# Patient Record
Sex: Male | Born: 1975 | Race: White | Hispanic: No | Marital: Married | Smoking: Current every day smoker
Health system: Southern US, Community
[De-identification: ages and names within clinical notes are randomized; demographics above are authoritative.]

## PROBLEM LIST (undated history)

## (undated) HISTORY — PX: APPENDECTOMY: SHX54

## (undated) HISTORY — PX: VASECTOMY: SHX75

---

## 2009-09-03 ENCOUNTER — Emergency Department (HOSPITAL_BASED_OUTPATIENT_CLINIC_OR_DEPARTMENT_OTHER): Admission: EM | Admit: 2009-09-03 | Discharge: 2009-09-03 | Payer: Self-pay | Admitting: Emergency Medicine

## 2009-09-04 ENCOUNTER — Emergency Department (HOSPITAL_BASED_OUTPATIENT_CLINIC_OR_DEPARTMENT_OTHER): Admission: EM | Admit: 2009-09-04 | Discharge: 2009-09-05 | Payer: Self-pay | Admitting: Emergency Medicine

## 2009-09-04 ENCOUNTER — Ambulatory Visit: Payer: Self-pay | Admitting: Diagnostic Radiology

## 2010-10-09 LAB — CBC
Hemoglobin: 15.9 g/dL (ref 13.0–17.0)
MCHC: 35.1 g/dL (ref 30.0–36.0)
MCV: 89.6 fL (ref 78.0–100.0)
RBC: 5.05 MIL/uL (ref 4.22–5.81)
RDW: 11.8 % (ref 11.5–15.5)

## 2010-10-09 LAB — URINALYSIS, ROUTINE W REFLEX MICROSCOPIC
Bilirubin Urine: NEGATIVE
Nitrite: NEGATIVE
Specific Gravity, Urine: 1.006 (ref 1.005–1.030)
Urobilinogen, UA: 1 mg/dL (ref 0.0–1.0)
pH: 6 (ref 5.0–8.0)

## 2010-10-09 LAB — COMPREHENSIVE METABOLIC PANEL
Albumin: 4.3 g/dL (ref 3.5–5.2)
Alkaline Phosphatase: 68 U/L (ref 39–117)
BUN: 18 mg/dL (ref 6–23)
Calcium: 8.8 mg/dL (ref 8.4–10.5)
GFR calc Af Amer: >60 mL/min (ref >60)
Glucose, Bld: 116 mg/dL — ABNORMAL HIGH (ref 70–99)
Sodium: 137 mEq/L (ref 135–145)
Total Bilirubin: 0.9 mg/dL (ref 0.3–1.2)
Total Protein: 8.7 g/dL — ABNORMAL HIGH (ref 6.0–8.3)

## 2010-10-09 LAB — DIFFERENTIAL
Lymphs Abs: 2.3 10*3/uL (ref 0.7–4.0)
Neutro Abs: 14.5 10*3/uL — ABNORMAL HIGH (ref 1.7–7.7)

## 2010-10-09 LAB — LIPASE, BLOOD: Lipase: 45 U/L (ref 23–300)

## 2011-03-22 IMAGING — CR DG CHEST 2V
2 series · 2 of 2 positions shown · non-contrast
Comparison: None.

CLINICAL DATA: Flu-like symptoms.  Nausea and vomiting.

CHEST - 2 VIEW 09/04/2009:

[w chest pa]
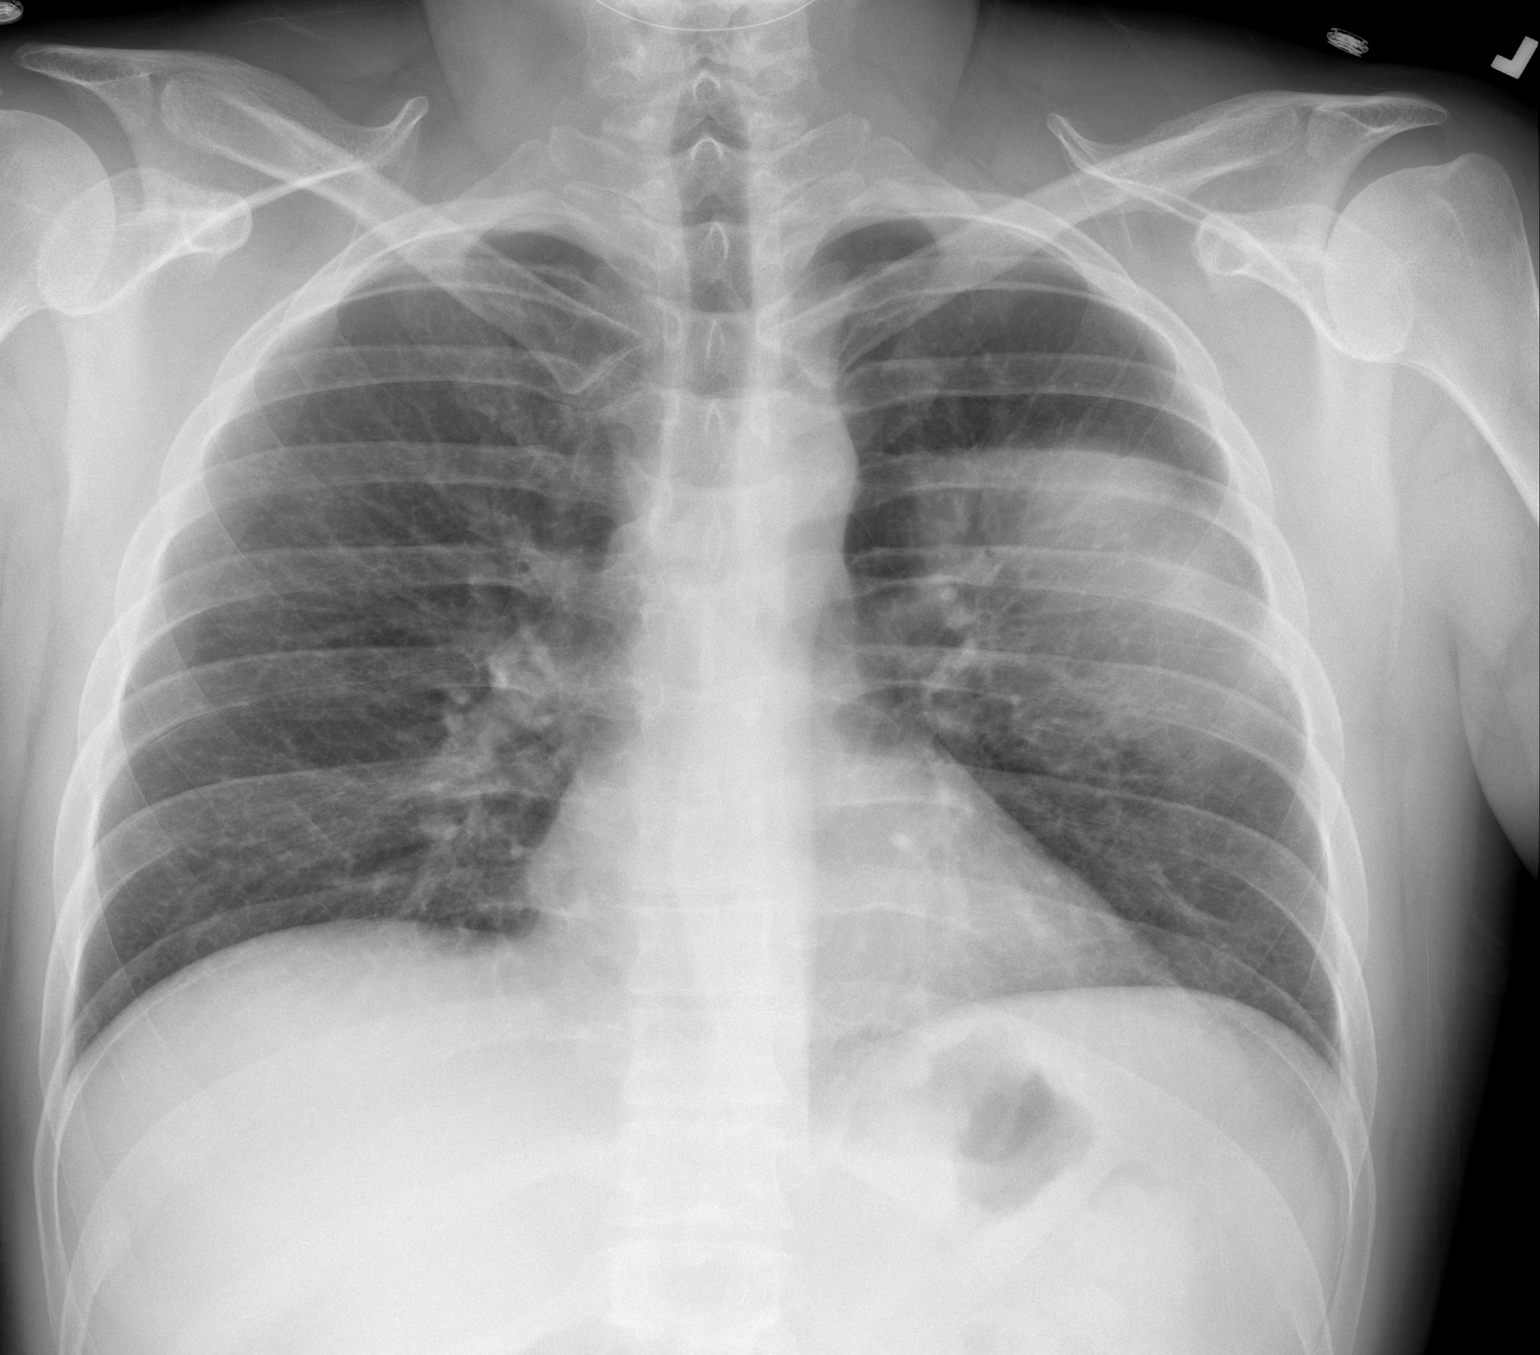

[w chest lat]
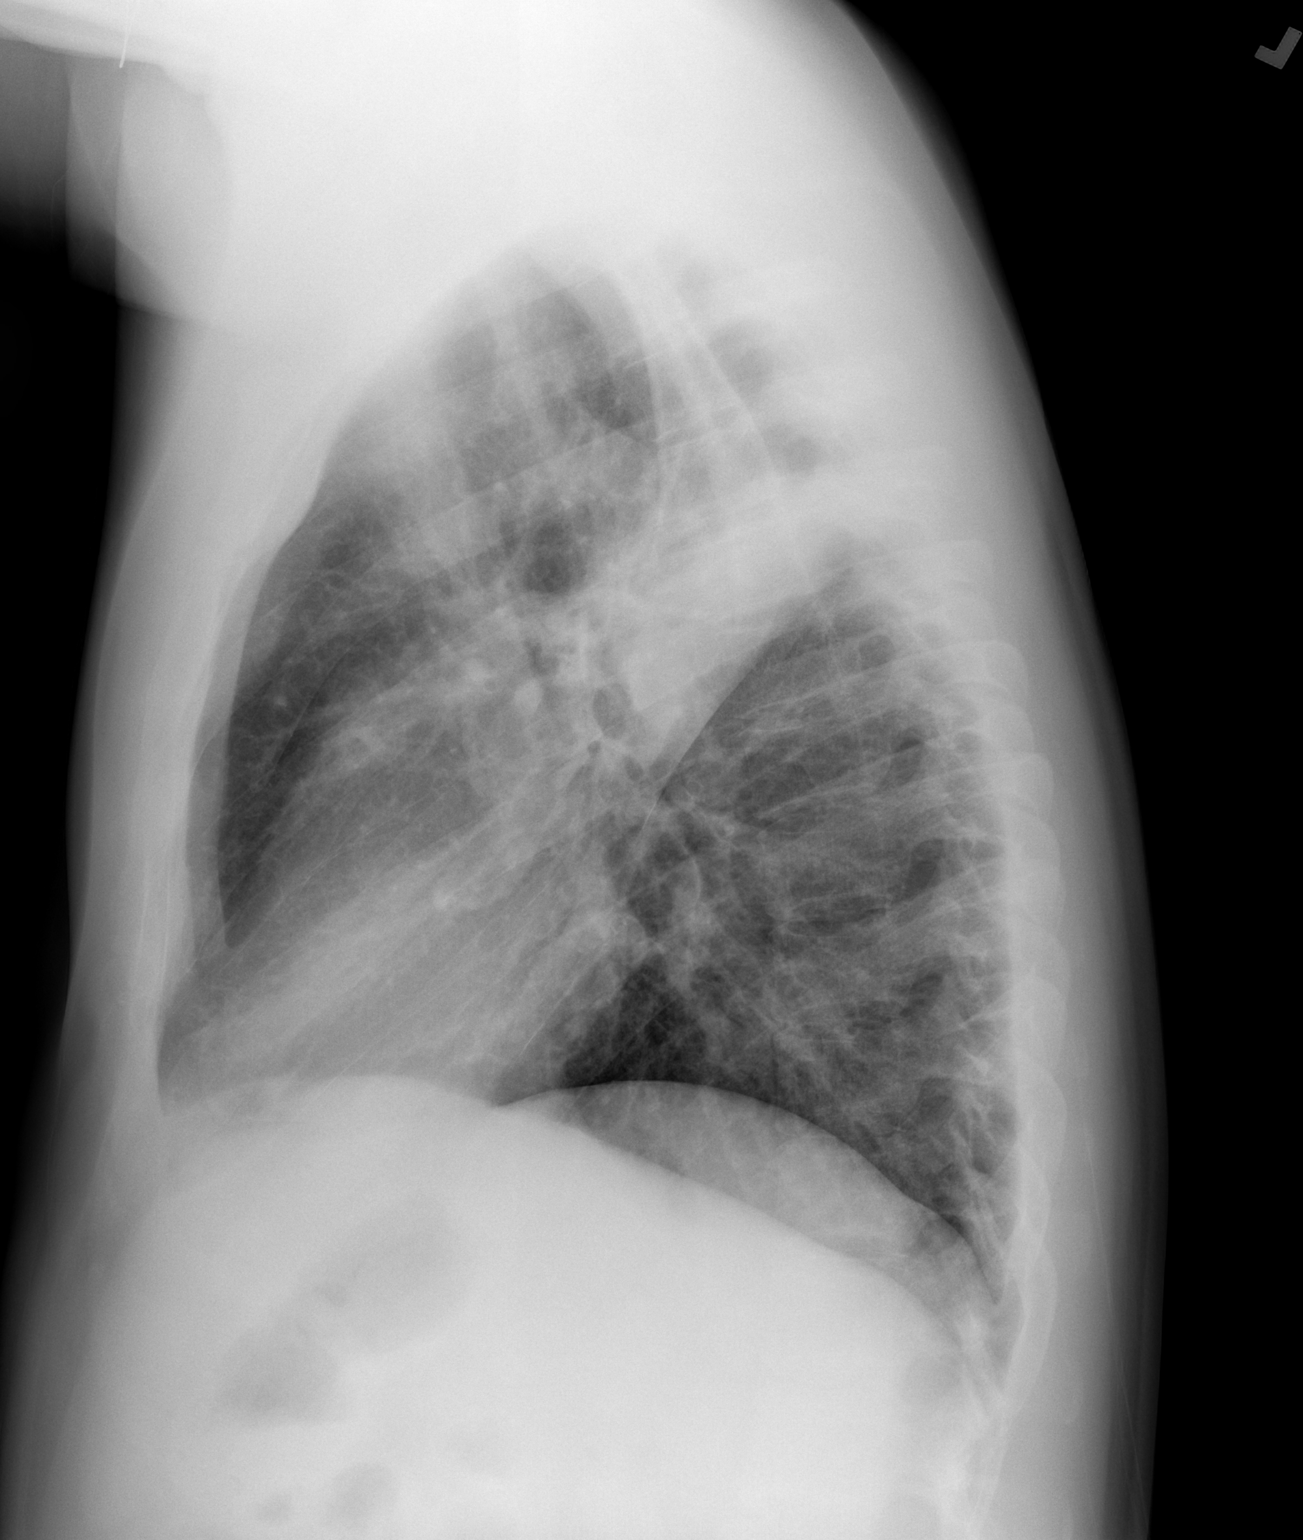

[2 of 2 positions shown; findings below may reference images not displayed]

FINDINGS: Dense airspace consolidation in the posterior left upper
lobe.  Lungs otherwise clear.  Cardiomediastinal silhouette
unremarkable.  No pleural effusions.  Visualized bony thorax
intact.
IMPRESSION: Left upper lobe pneumonia.

## 2015-03-02 ENCOUNTER — Observation Stay (HOSPITAL_BASED_OUTPATIENT_CLINIC_OR_DEPARTMENT_OTHER)
Admission: EM | Admit: 2015-03-02 | Discharge: 2015-03-03 | Disposition: A | Discharge status: Home / Self Care | Payer: BLUE CROSS/BLUE SHIELD | Attending: Surgery | Admitting: Surgery

## 2015-03-02 ENCOUNTER — Encounter (HOSPITAL_COMMUNITY)
Admission: EM | Disposition: A | Discharge status: Home / Self Care | Payer: Self-pay | Source: Home / Self Care | Attending: Physician Assistant

## 2015-03-02 ENCOUNTER — Emergency Department (HOSPITAL_COMMUNITY): Payer: BLUE CROSS/BLUE SHIELD | Admitting: Anesthesiology

## 2015-03-02 ENCOUNTER — Emergency Department (HOSPITAL_BASED_OUTPATIENT_CLINIC_OR_DEPARTMENT_OTHER): Payer: BLUE CROSS/BLUE SHIELD

## 2015-03-02 ENCOUNTER — Encounter (HOSPITAL_BASED_OUTPATIENT_CLINIC_OR_DEPARTMENT_OTHER): Payer: Self-pay | Admitting: *Deleted

## 2015-03-02 DIAGNOSIS — F1290 Cannabis use, unspecified, uncomplicated: Secondary | ICD-10-CM | POA: Insufficient documentation

## 2015-03-02 DIAGNOSIS — F17210 Nicotine dependence, cigarettes, uncomplicated: Secondary | ICD-10-CM | POA: Insufficient documentation

## 2015-03-02 DIAGNOSIS — K3580 Unspecified acute appendicitis: Secondary | ICD-10-CM | POA: Diagnosis not present

## 2015-03-02 DIAGNOSIS — R112 Nausea with vomiting, unspecified: Secondary | ICD-10-CM | POA: Diagnosis present

## 2015-03-02 DIAGNOSIS — Z885 Allergy status to narcotic agent status: Secondary | ICD-10-CM | POA: Insufficient documentation

## 2015-03-02 DIAGNOSIS — K3589 Other acute appendicitis without perforation or gangrene: Secondary | ICD-10-CM

## 2015-03-02 HISTORY — PX: LAPAROSCOPIC APPENDECTOMY: SHX408

## 2015-03-02 LAB — COMPREHENSIVE METABOLIC PANEL
ALBUMIN: 4.2 g/dL (ref 3.5–5.0)
ALK PHOS: 41 U/L (ref 38–126)
ALT: 14 U/L — ABNORMAL LOW (ref 17–63)
ANION GAP: 10 (ref 5–15)
AST: 22 U/L (ref 15–41)
BILIRUBIN TOTAL: 0.9 mg/dL (ref 0.3–1.2)
BUN: 11 mg/dL (ref 6–20)
CALCIUM: 9.2 mg/dL (ref 8.9–10.3)
CHLORIDE: 101 mmol/L (ref 101–111)
CO2: 25 mmol/L (ref 22–32)
Creatinine, Ser: 0.96 mg/dL (ref 0.61–1.24)
GFR calc Af Amer: >60 mL/min (ref >60)
GFR calc non Af Amer: >60 mL/min (ref >60)
GLUCOSE: 129 mg/dL — AB (ref 65–99)
Potassium: 3.5 mmol/L (ref 3.5–5.1)
SODIUM: 136 mmol/L (ref 135–145)
Total Protein: 7.2 g/dL (ref 6.5–8.1)

## 2015-03-02 LAB — CBC WITH DIFFERENTIAL/PLATELET
BASOS ABS: 0 10*3/uL (ref 0.0–0.1)
Basophils Relative: 0 % (ref 0–1)
EOS PCT: 0 % (ref 0–5)
Eosinophils Absolute: 0 10*3/uL (ref 0.0–0.7)
HEMATOCRIT: 46.3 % (ref 39.0–52.0)
Hemoglobin: 16 g/dL (ref 13.0–17.0)
LYMPHS ABS: 1.4 10*3/uL (ref 0.7–4.0)
Lymphocytes Relative: 10 % — ABNORMAL LOW (ref 12–46)
MCH: 31.3 pg (ref 26.0–34.0)
MCHC: 34.6 g/dL (ref 30.0–36.0)
MCV: 90.6 fL (ref 78.0–100.0)
MONO ABS: 1 10*3/uL (ref 0.1–1.0)
Monocytes Relative: 7 % (ref 3–12)
Neutro Abs: 11.7 10*3/uL — ABNORMAL HIGH (ref 1.7–7.7)
Neutrophils Relative %: 83 % — ABNORMAL HIGH (ref 43–77)
PLATELETS: 304 10*3/uL (ref 150–400)
RBC: 5.11 MIL/uL (ref 4.22–5.81)
RDW: 13 % (ref 11.5–15.5)
WBC: 14.1 10*3/uL — AB (ref 4.0–10.5)

## 2015-03-02 LAB — URINALYSIS, ROUTINE W REFLEX MICROSCOPIC
BILIRUBIN URINE: NEGATIVE
GLUCOSE, UA: NEGATIVE mg/dL
HGB URINE DIPSTICK: NEGATIVE
KETONES UR: NEGATIVE mg/dL
LEUKOCYTES UA: NEGATIVE
Nitrite: NEGATIVE
Protein, ur: NEGATIVE mg/dL
SPECIFIC GRAVITY, URINE: 1.01 (ref 1.005–1.030)
UROBILINOGEN UA: 0.2 mg/dL (ref 0.0–1.0)
pH: 8.5 — ABNORMAL HIGH (ref 5.0–8.0)

## 2015-03-02 LAB — LIPASE, BLOOD: Lipase: 20 U/L — ABNORMAL LOW (ref 22–51)

## 2015-03-02 SURGERY — APPENDECTOMY, LAPAROSCOPIC
Anesthesia: General | Site: Abdomen

## 2015-03-02 MED ORDER — LACTATED RINGERS IR SOLN
Status: DC | PRN
  Administered 2015-03-02: 1000 mL

## 2015-03-02 MED ORDER — PROPOFOL 10 MG/ML IV BOLUS
INTRAVENOUS | Status: AC
  Filled 2015-03-02: qty 20

## 2015-03-02 MED ORDER — FENTANYL CITRATE (PF) 250 MCG/5ML IJ SOLN
INTRAMUSCULAR | Status: AC
  Filled 2015-03-02: qty 25

## 2015-03-02 MED ORDER — FENTANYL CITRATE (PF) 100 MCG/2ML IJ SOLN
100.0000 ug | Freq: Once | INTRAMUSCULAR | Status: AC
  Administered 2015-03-02: 100 ug via INTRAVENOUS
  Filled 2015-03-02: qty 2

## 2015-03-02 MED ORDER — GLYCOPYRROLATE 0.2 MG/ML IJ SOLN
INTRAMUSCULAR | Status: AC
  Filled 2015-03-02: qty 3

## 2015-03-02 MED ORDER — LACTATED RINGERS IV SOLN
INTRAVENOUS | Status: DC | PRN
  Administered 2015-03-02 (×2): via INTRAVENOUS

## 2015-03-02 MED ORDER — DEXAMETHASONE SODIUM PHOSPHATE 10 MG/ML IJ SOLN
INTRAMUSCULAR | Status: DC | PRN
  Administered 2015-03-02: 10 mg via INTRAVENOUS

## 2015-03-02 MED ORDER — NICOTINE 14 MG/24HR TD PT24
14.0000 mg | MEDICATED_PATCH | Freq: Every day | TRANSDERMAL | Status: DC
  Filled 2015-03-02 (×2): qty 1

## 2015-03-02 MED ORDER — NEOSTIGMINE METHYLSULFATE 10 MG/10ML IV SOLN
INTRAVENOUS | Status: AC
  Filled 2015-03-02: qty 1

## 2015-03-02 MED ORDER — GLYCOPYRROLATE 0.2 MG/ML IJ SOLN
INTRAMUSCULAR | Status: DC | PRN
  Administered 2015-03-02: .8 mg via INTRAVENOUS

## 2015-03-02 MED ORDER — DEXTROSE 5 % IV SOLN
2.0000 g | INTRAVENOUS | Status: DC | PRN
  Administered 2015-03-02: 2 g via INTRAVENOUS

## 2015-03-02 MED ORDER — LIDOCAINE HCL (CARDIAC) 20 MG/ML IV SOLN
INTRAVENOUS | Status: DC | PRN
  Administered 2015-03-02: 100 mg via INTRAVENOUS

## 2015-03-02 MED ORDER — PROMETHAZINE HCL 25 MG/ML IJ SOLN: 6.2500 mg | INTRAMUSCULAR | Status: DC | PRN

## 2015-03-02 MED ORDER — ONDANSETRON 4 MG PO TBDP: 4.0000 mg | ORAL_TABLET | Freq: Four times a day (QID) | ORAL | Status: DC | PRN

## 2015-03-02 MED ORDER — LIDOCAINE HCL (CARDIAC) 20 MG/ML IV SOLN
INTRAVENOUS | Status: AC
  Filled 2015-03-02: qty 5

## 2015-03-02 MED ORDER — HYDROMORPHONE HCL 1 MG/ML IJ SOLN
INTRAMUSCULAR | Status: AC
  Filled 2015-03-02: qty 1

## 2015-03-02 MED ORDER — PROPOFOL 10 MG/ML IV BOLUS
INTRAVENOUS | Status: DC | PRN
  Administered 2015-03-02: 200 mg via INTRAVENOUS

## 2015-03-02 MED ORDER — ONDANSETRON HCL 4 MG/2ML IJ SOLN: 4.0000 mg | Freq: Four times a day (QID) | INTRAMUSCULAR | Status: DC | PRN

## 2015-03-02 MED ORDER — BUPIVACAINE-EPINEPHRINE 0.25% -1:200000 IJ SOLN
INTRAMUSCULAR | Status: DC | PRN
  Administered 2015-03-02: 7 mL

## 2015-03-02 MED ORDER — ONDANSETRON HCL 4 MG/2ML IJ SOLN
INTRAMUSCULAR | Status: DC | PRN
  Administered 2015-03-02: 4 mg via INTRAVENOUS

## 2015-03-02 MED ORDER — ONDANSETRON HCL 4 MG/2ML IJ SOLN
4.0000 mg | Freq: Once | INTRAMUSCULAR | Status: AC
  Administered 2015-03-02: 4 mg via INTRAVENOUS

## 2015-03-02 MED ORDER — ROCURONIUM BROMIDE 100 MG/10ML IV SOLN
INTRAVENOUS | Status: DC | PRN
  Administered 2015-03-02: 30 mg via INTRAVENOUS
  Administered 2015-03-02: 10 mg via INTRAVENOUS

## 2015-03-02 MED ORDER — LACTATED RINGERS IV SOLN: INTRAVENOUS | Status: DC

## 2015-03-02 MED ORDER — BUPIVACAINE-EPINEPHRINE 0.25% -1:200000 IJ SOLN
INTRAMUSCULAR | Status: AC
Start: 2015-03-02 — End: 2015-03-02
  Filled 2015-03-02: qty 1

## 2015-03-02 MED ORDER — ROCURONIUM BROMIDE 100 MG/10ML IV SOLN
INTRAVENOUS | Status: AC
  Filled 2015-03-02: qty 1

## 2015-03-02 MED ORDER — MIDAZOLAM HCL 2 MG/2ML IJ SOLN
INTRAMUSCULAR | Status: AC
  Filled 2015-03-02: qty 4

## 2015-03-02 MED ORDER — IOHEXOL 300 MG/ML  SOLN
100.0000 mL | Freq: Once | INTRAMUSCULAR | Status: AC | PRN
  Administered 2015-03-02: 100 mL via INTRAVENOUS

## 2015-03-02 MED ORDER — MIDAZOLAM HCL 5 MG/5ML IJ SOLN
INTRAMUSCULAR | Status: DC | PRN
  Administered 2015-03-02: 2 mg via INTRAVENOUS

## 2015-03-02 MED ORDER — ONDANSETRON HCL 4 MG/2ML IJ SOLN
INTRAMUSCULAR | Status: AC
  Filled 2015-03-02: qty 2

## 2015-03-02 MED ORDER — NEOSTIGMINE METHYLSULFATE 10 MG/10ML IV SOLN
INTRAVENOUS | Status: DC | PRN
  Administered 2015-03-02: 5 mg via INTRAVENOUS

## 2015-03-02 MED ORDER — DEXTROSE 5 % IV SOLN
INTRAVENOUS | Status: AC
  Filled 2015-03-02: qty 2

## 2015-03-02 MED ORDER — FENTANYL CITRATE (PF) 100 MCG/2ML IJ SOLN
100.0000 ug | Freq: Once | INTRAMUSCULAR | Status: AC
  Administered 2015-03-02 (×2): 100 ug via INTRAVENOUS
  Administered 2015-03-02: 50 ug via INTRAVENOUS

## 2015-03-02 MED ORDER — HYDROMORPHONE HCL 1 MG/ML IJ SOLN
0.5000 mg | INTRAMUSCULAR | Status: DC | PRN
  Administered 2015-03-02: 0.5 mg via INTRAVENOUS
  Filled 2015-03-02: qty 1

## 2015-03-02 MED ORDER — HYDROMORPHONE HCL 1 MG/ML IJ SOLN
0.2500 mg | INTRAMUSCULAR | Status: DC | PRN
  Administered 2015-03-02 (×2): 0.5 mg via INTRAVENOUS

## 2015-03-02 MED ORDER — IOHEXOL 300 MG/ML  SOLN
50.0000 mL | Freq: Once | INTRAMUSCULAR | Status: AC | PRN
  Administered 2015-03-02: 50 mL via ORAL

## 2015-03-02 MED ORDER — HYDROCODONE-ACETAMINOPHEN 5-325 MG PO TABS
1.0000 | ORAL_TABLET | ORAL | Status: DC | PRN
  Administered 2015-03-02 – 2015-03-03 (×2): 2 via ORAL
  Filled 2015-03-02 (×2): qty 2

## 2015-03-02 MED ORDER — SUCCINYLCHOLINE CHLORIDE 20 MG/ML IJ SOLN
INTRAMUSCULAR | Status: DC | PRN
  Administered 2015-03-02: 100 mg via INTRAVENOUS

## 2015-03-02 MED ORDER — 0.9 % SODIUM CHLORIDE (POUR BTL) OPTIME
TOPICAL | Status: DC | PRN
  Administered 2015-03-02: 1000 mL

## 2015-03-02 MED ORDER — ONDANSETRON HCL 4 MG/2ML IJ SOLN
INTRAMUSCULAR | Status: AC
  Administered 2015-03-02: 4 mg via INTRAVENOUS
  Filled 2015-03-02: qty 2

## 2015-03-02 MED ORDER — SENNOSIDES-DOCUSATE SODIUM 8.6-50 MG PO TABS: 1.0000 | ORAL_TABLET | Freq: Every evening | ORAL | Status: DC | PRN

## 2015-03-02 MED ORDER — ENOXAPARIN SODIUM 40 MG/0.4ML ~~LOC~~ SOLN
40.0000 mg | SUBCUTANEOUS | Status: DC
  Filled 2015-03-02 (×2): qty 0.4

## 2015-03-02 MED ORDER — SODIUM CHLORIDE 0.9 % IV BOLUS (SEPSIS)
1000.0000 mL | Freq: Once | INTRAVENOUS | Status: AC
  Administered 2015-03-02: 1000 mL via INTRAVENOUS

## 2015-03-02 SURGICAL SUPPLY — 37 items
APPLIER CLIP 5 13 M/L LIGAMAX5 (MISCELLANEOUS)
APPLIER CLIP ROT 10 11.4 M/L (STAPLE)
CABLE HIGH FREQUENCY MONO STRZ (ELECTRODE) ×3 IMPLANT
CHLORAPREP W/TINT 26ML (MISCELLANEOUS) ×3 IMPLANT
CLIP APPLIE 5 13 M/L LIGAMAX5 (MISCELLANEOUS) IMPLANT
CLIP APPLIE ROT 10 11.4 M/L (STAPLE) IMPLANT
COVER SURGICAL LIGHT HANDLE (MISCELLANEOUS) ×3 IMPLANT
DECANTER SPIKE VIAL GLASS SM (MISCELLANEOUS) ×3 IMPLANT
DRAPE LAPAROSCOPIC ABDOMINAL (DRAPES) ×3 IMPLANT
ELECT REM PT RETURN 9FT ADLT (ELECTROSURGICAL) ×3
ELECTRODE REM PT RTRN 9FT ADLT (ELECTROSURGICAL) ×1 IMPLANT
GLOVE BIO SURGEON STRL SZ 6.5 (GLOVE) ×2 IMPLANT
GLOVE BIO SURGEONS STRL SZ 6.5 (GLOVE) ×1
GLOVE BIOGEL PI IND STRL 7.0 (GLOVE) ×1 IMPLANT
GLOVE BIOGEL PI INDICATOR 7.0 (GLOVE) ×2
GOWN STRL REUS W/TWL 2XL LVL3 (GOWN DISPOSABLE) ×3 IMPLANT
GOWN STRL REUS W/TWL XL LVL3 (GOWN DISPOSABLE) ×3 IMPLANT
HANDLE STAPLE EGIA 4 XL (STAPLE) ×3 IMPLANT
KIT BASIN OR (CUSTOM PROCEDURE TRAY) ×3 IMPLANT
LIQUID BAND (GAUZE/BANDAGES/DRESSINGS) ×3 IMPLANT
PEN SKIN MARKING BROAD (MISCELLANEOUS) ×3 IMPLANT
POUCH SPECIMEN RETRIEVAL 10MM (ENDOMECHANICALS) ×3 IMPLANT
RELOAD EGIA 45 MED/THCK PURPLE (STAPLE) ×3 IMPLANT
RELOAD EGIA 45 TAN VASC (STAPLE) ×3 IMPLANT
SET IRRIG TUBING LAPAROSCOPIC (IRRIGATION / IRRIGATOR) ×3 IMPLANT
SLEEVE XCEL OPT CAN 5 100 (ENDOMECHANICALS) IMPLANT
SUT VIC AB 2-0 SH 27 (SUTURE) ×2
SUT VIC AB 2-0 SH 27X BRD (SUTURE) ×1 IMPLANT
SUT VIC AB 4-0 PS2 27 (SUTURE) ×3 IMPLANT
TOWEL OR 17X26 10 PK STRL BLUE (TOWEL DISPOSABLE) ×3 IMPLANT
TRAY FOLEY W/METER SILVER 14FR (SET/KITS/TRAYS/PACK) ×3 IMPLANT
TRAY FOLEY W/METER SILVER 16FR (SET/KITS/TRAYS/PACK) ×3 IMPLANT
TRAY LAPAROSCOPIC (CUSTOM PROCEDURE TRAY) ×3 IMPLANT
TROCAR BLADELESS OPT 5 100 (ENDOMECHANICALS) IMPLANT
TROCAR BLADELESS OPT 5 75 (ENDOMECHANICALS) IMPLANT
TROCAR SLEEVE XCEL 5X75 (ENDOMECHANICALS) IMPLANT
TROCAR XCEL BLUNT TIP 100MML (ENDOMECHANICALS) ×3 IMPLANT

## 2015-03-02 NOTE — ED Notes (Signed)
Bed: WA08 Expected date: 03/02/15 Expected time: 12:01 PM Means of arrival: Ambulance Comments: tx from [facility] appendicitis to see Dr Maisie Fus

## 2015-03-02 NOTE — Transfer of Care (Signed)
Immediate Anesthesia Transfer of Care Note  Patient: Arthur Powell  Procedure(s) Performed: Procedure(s): APPENDECTOMY LAPAROSCOPIC (N/A)  Patient Location: PACU  Anesthesia Type:General  Level of Consciousness: awake and alert   Airway & Oxygen Therapy: Patient Spontanous Breathing and Patient connected to face mask oxygen  Post-op Assessment: Report given to RN and Post -op Vital signs reviewed and stable  Post vital signs: Reviewed and stable  Last Vitals:  Filed Vitals:   03/02/15 1208  BP: 114/77  Pulse: 65  Temp: 36.8 C  Resp: 16    Complications: No apparent anesthesia complications

## 2015-03-02 NOTE — ED Provider Notes (Signed)
CSN: [id number]     Arrival date & time 03/02/15  0815 History   First MD Initiated Contact with Patient 03/02/15 0825     Chief Complaint  Patient presents with  . Emesis     (Consider location/radiation/quality/duration/timing/severity/associated sxs/prior Treatment) HPI   Patient is a 39 year old male presenting with nausea vomiting starting last night. Patient states that he had some nausea and vomiting started overnight at work. He's had a couple loose stools, denies diarrhea. No fevers.  Patient smokes weed multiple times a day. He states that hot showers to help him with his nausea and vomiting. Patient also is a heavy drinker.  History reviewed. No pertinent past medical history. History reviewed. No pertinent past surgical history. History reviewed. No pertinent family history. Social History  Substance Use Topics  . Smoking status: Current Every Day Smoker  . Smokeless tobacco: None  . Alcohol Use: Yes    Review of Systems  Constitutional: Negative for fever and activity change.  HENT: Negative for drooling and hearing loss.   Eyes: Negative for discharge and redness.  Respiratory: Negative for cough and shortness of breath.   Cardiovascular: Negative for chest pain.  Gastrointestinal: Positive for nausea, vomiting and abdominal pain.  Genitourinary: Negative for dysuria, urgency and flank pain.  Musculoskeletal: Negative for arthralgias.  Allergic/Immunologic: Negative for immunocompromised state.  Neurological: Negative for seizures and speech difficulty.  Psychiatric/Behavioral: Negative for behavioral problems and agitation.  All other systems reviewed and are negative.     Allergies  Morphine and related  Home Medications   Prior to Admission medications   Not on File   BP 124/82 mmHg  Pulse 90  Temp(Src) 97.5 F (36.4 C) (Oral)  Resp 20  Ht  (1.753 m)  Wt 183 lb (83.008 kg)  BMI 27.01 kg/m2  SpO2 100% Physical Exam  Constitutional: He  is oriented to person, place, and time. He appears well-nourished.  Dry mucous membranes.  HENT:  Head: Normocephalic.  Mouth/Throat: Oropharynx is clear and moist.  Eyes: Conjunctivae are normal.  Neck: No tracheal deviation present.  Cardiovascular: Normal rate.   Pulmonary/Chest: Effort normal. No stridor. No respiratory distress.  Abdominal: There is tenderness. There is guarding.  Patient has tenderness to the right upper quadrant and right lower quadrant.  Musculoskeletal: Normal range of motion. He exhibits no edema.  Neurological: He is oriented to person, place, and time. No cranial nerve deficit.  Skin: Skin is warm and dry. No rash noted. He is not diaphoretic.  Psychiatric: He has a normal mood and affect. His behavior is normal.  Nursing note and vitals reviewed.   ED Course  Procedures (including critical care time) Labs Review Labs Reviewed  URINE CULTURE  COMPREHENSIVE METABOLIC PANEL  LIPASE, BLOOD  URINALYSIS, ROUTINE W REFLEX MICROSCOPIC (NOT AT [facility])  CBC WITH DIFFERENTIAL/PLATELET    Imaging Review No results found. I, Spring San L Zakya Halabi, personally reviewed and evaluated these images and lab results as part of my medical decision-making.   EKG Interpretation None      MDM   Final diagnoses:  None   patient is a 39 year old gentleman presenting today with nausea and vomiting. Differential includes hyperemesis of cannabis, pancreatitis, viral GI syndrome, appendicitis, cholecystitis. We'll get labs and give Zofran and fluids. We'll get CT scan given patient's tenderness to palpation.  Patient's vital signs are reassuring at this time.  Patient's CT shows appendicitis, will transfer to [facility] for surgery.   Taahir Grisby Randall An, MD 03/02/15 1507

## 2015-03-02 NOTE — Op Note (Signed)
Arthur Powell [id number]   PRE-OPERATIVE DIAGNOSIS:  Acute appendicitis  POST-OPERATIVE DIAGNOSIS:  Acute appendicitis   Procedure(s): APPENDECTOMY LAPAROSCOPIC  SURGEON:  Surgeon(s): Romie Levee, MD  ASSISTANT: none   ANESTHESIA:   local and general  EBL:   min  Delay start of Pharmacological VTE agent (>24hrs) due to surgical blood loss or risk of bleeding:  no  DRAINS: none   SPECIMEN:  Source of Specimen:  appendix  DISPOSITION OF SPECIMEN:  PATHOLOGY  COUNTS:  YES  PLAN OF CARE: Admit for overnight observation  PATIENT DISPOSITION:  PACU - hemodynamically stable.   INDICATIONS: Patient with concerning symptoms & work up suspicious for appendicitis.  Surgery was recommended:  The anatomy & physiology of the digestive tract was discussed.  The pathophysiology of appendicitis was discussed.  Natural history risks without surgery was discussed.   I feel the risks of no intervention will lead to serious problems that outweigh the operative risks; therefore, I recommended diagnostic laparoscopy with removal of appendix to remove the pathology.  Laparoscopic & open techniques were discussed.   I noted a good likelihood this will help address the problem.    Risks such as bleeding, infection, abscess, leak, reoperation, possible ostomy, hernia, heart attack, death, and other risks were discussed.  Goals of post-operative recovery were discussed as well.  We will work to minimize complications.  Questions were answered.  The patient expresses understanding & wishes to proceed with surgery.  OR FINDINGS: acute appendicitis  DESCRIPTION:   The patient was identified & brought into the operating room. The patient was positioned supine with left arm tucked. SCDs were active during the entire case. The patient underwent general anesthesia without any difficulty.  A foley catheter was inserted under sterile conditions. The abdomen was prepped and draped in a sterile  fashion. A Surgical Timeout confirmed our plan.   I made a vertical incision through the inferior umbilical fold.  I made a nick in the infraumbilical fascia and confirmed peritoneal entry.  I placed a stay suture and then the [facility] port.  We induced carbon dioxide insufflation.  Camera inspection revealed no injury.  I placed additional ports under direct laparoscopic visualization.  I mobilized the terminal ileum to proximal ascending colon in a lateral to medial fashion.  I took care to avoid injuring any retroperitoneal structures.   I freed the appendix off its attachments to the ascending colon and cecal mesentery.  I elevated the appendix.  I was able to free off the base of the appendix, which was still viable.  I stapled the appendix off the cecum using a laparoscopic purple load Covidian stapler.  I took a healthy cuff viable cecum. I skeletonized & ligated the mesoappendix with a vascular load stapler.   I placed the appendix inside an EndoCatch bag and removed out the [place] port.  I did copious irrigation. Hemostasis was good in the mesoappendix, colon mesentery, and retroperitoneum. Staple line was intact on the cecum with no bleeding. I washed out the pelvis, retrohepatic space and right paracolic gutter.  Hemostasis is good. There was no perforation or injury.  Because the area cleaned up well after irrigation, I did not place a drain.  I aspirated the carbon dioxide. I removed the ports. I closed the umbilical fascia site using a 0 Vicryl stitch. I closed skin using 4-0 vicryl stitch.  Sterile dressings were applied.  Patient was extubated and sent to the recovery room.  I discussed the operative findings with the  patient's family. I suspect the patient is going used in the hospital at least overnight and will need antibiotics for 0 days. Questions answered. They expressed understanding and appreciation.

## 2015-03-02 NOTE — Anesthesia Postprocedure Evaluation (Signed)
  Anesthesia Post-op Note  Patient: Arthur Powell  Procedure(s) Performed: Procedure(s) (LRB): APPENDECTOMY LAPAROSCOPIC (N/A)  Patient Location: PACU  Anesthesia Type: General  Level of Consciousness: awake and alert   Airway and Oxygen Therapy: Patient Spontanous Breathing  Post-op Pain: mild  Post-op Assessment: Post-op Vital signs reviewed, Patient's Cardiovascular Status Stable, Respiratory Function Stable, Patent Airway and No signs of Nausea or vomiting  Last Vitals:  Filed Vitals:   03/02/15 1530  BP: 108/62  Pulse: 60  Temp: 36.3 C  Resp: 18    Post-op Vital Signs: stable   Complications: No apparent anesthesia complications

## 2015-03-02 NOTE — ED Notes (Signed)
patient c/o vomiting/abd pain since around 1am

## 2015-03-02 NOTE — ED Notes (Signed)
Gave family member a coke and crackers

## 2015-03-02 NOTE — ED Notes (Signed)
PER [facility] pt transported from [facility] c/o appendicitis. Last food/drink last night.  IV in place. C/o r side abd pain. PT will be seen by Dr Maisie Fus for surgery.

## 2015-03-02 NOTE — H&P (Signed)
Arthur Powell is an 39 y.o. male.   Chief Complaint: Abd pain, nausea and vomiting.   HPI: Pt presents to ED with 24hrs of nausea, vomiting and abd pain.  He states that he had crampy abd pain yesterday am that progressed to vomiting and nausea.  He presented to the ED this am.  History reviewed. No pertinent past medical history.  History reviewed. No pertinent past surgical history.  History reviewed. No pertinent family history. Social History:  reports that he has been smoking.  He does not have any smokeless tobacco history on file. He reports that he drinks alcohol. He reports that he uses illicit drugs (Marijuana).  Allergies:  Allergies  Allergen Reactions  . Morphine And Related     Vomiting      (Not in a hospital admission)  Results for orders placed or performed during the hospital encounter of 03/02/15 (from the past 48 hour(s))  Comprehensive metabolic panel     Status: Abnormal   Collection Time: 03/02/15  8:45 AM  Result Value Ref Range   Sodium 136 135 - 145 mmol/L   Potassium 3.5 3.5 - 5.1 mmol/L   Chloride 101 101 - 111 mmol/L   CO2 25 22 - 32 mmol/L   Glucose, Bld 129 (H) 65 - 99 mg/dL   BUN 11 6 - 20 mg/dL   Creatinine, Ser 0.96 0.61 - 1.24 mg/dL   Calcium 9.2 8.9 - 10.3 mg/dL   Total Protein 7.2 6.5 - 8.1 g/dL   Albumin 4.2 3.5 - 5.0 g/dL   AST 22 15 - 41 U/L   ALT 14 (L) 17 - 63 U/L   Alkaline Phosphatase 41 38 - 126 U/L   Total Bilirubin 0.9 0.3 - 1.2 mg/dL   GFR calc non Af Amer >60 >60 mL/min   GFR calc Af Amer >60 >60 mL/min    Comment: (NOTE) The eGFR has been calculated using the CKD EPI equation. This calculation has not been validated in all clinical situations. eGFR's persistently <60 mL/min signify possible Chronic Kidney Disease.    Anion gap 10 5 - 15  Lipase, blood     Status: Abnormal   Collection Time: 03/02/15  8:45 AM  Result Value Ref Range   Lipase 20 (L) 22 - 51 U/L  CBC with Differential/Platelet     Status:  Abnormal   Collection Time: 03/02/15  8:45 AM  Result Value Ref Range   WBC 14.1 (H) 4.0 - 10.5 K/uL   RBC 5.11 4.22 - 5.81 MIL/uL   Hemoglobin 16.0 13.0 - 17.0 g/dL   HCT 46.3 39.0 - 52.0 %   MCV 90.6 78.0 - 100.0 fL   MCH 31.3 26.0 - 34.0 pg   MCHC 34.6 30.0 - 36.0 g/dL   RDW 13.0 11.5 - 15.5 %   Platelets 304 150 - 400 K/uL   Neutrophils Relative % 83 (H) 43 - 77 %   Neutro Abs 11.7 (H) 1.7 - 7.7 K/uL   Lymphocytes Relative 10 (L) 12 - 46 %   Lymphs Abs 1.4 0.7 - 4.0 K/uL   Monocytes Relative 7 3 - 12 %   Monocytes Absolute 1.0 0.1 - 1.0 K/uL   Eosinophils Relative 0 0 - 5 %   Eosinophils Absolute 0.0 0.0 - 0.7 K/uL   Basophils Relative 0 0 - 1 %   Basophils Absolute 0.0 0.0 - 0.1 K/uL  Urinalysis, Routine w reflex microscopic (not at [facility])     Status: Abnormal  Collection Time: 03/02/15  9:50 AM  Result Value Ref Range   Color, Urine YELLOW YELLOW   APPearance CLEAR CLEAR   Specific Gravity, Urine 1.010 1.005 - 1.030   pH 8.5 (H) 5.0 - 8.0   Glucose, UA NEGATIVE NEGATIVE mg/dL   Hgb urine dipstick NEGATIVE NEGATIVE   Bilirubin Urine NEGATIVE NEGATIVE   Ketones, ur NEGATIVE NEGATIVE mg/dL   Protein, ur NEGATIVE NEGATIVE mg/dL   Urobilinogen, UA 0.2 0.0 - 1.0 mg/dL   Nitrite NEGATIVE NEGATIVE   Leukocytes, UA NEGATIVE NEGATIVE    Comment: MICROSCOPIC NOT DONE ON URINES WITH NEGATIVE PROTEIN, BLOOD, LEUKOCYTES, NITRITE, OR GLUCOSE <1000 mg/dL.   Ct Abdomen Pelvis W Contrast  03/02/2015   CLINICAL DATA:  Acute right-sided abdominal pain.  EXAM: CT ABDOMEN AND PELVIS WITH CONTRAST  TECHNIQUE: Multidetector CT imaging of the abdomen and pelvis was performed using the standard protocol following bolus administration of intravenous contrast.  CONTRAST:  19m OMNIPAQUE IOHEXOL 300 MG/ML SOLN, 557mOMNIPAQUE IOHEXOL 300 MG/ML SOLN  COMPARISON:  None.  FINDINGS: Visualized lung bases appear normal. No significant osseous abnormality is noted.  No gallstones are noted. The liver,  spleen or pancreas appear normal. Adrenal glands and kidneys appear normal. No hydronephrosis or renal obstruction is noted. The appendix is enlarged with surrounding inflammation consistent with appendicitis. No abscess or fluid collection is noted. There is no evidence of bowel obstruction. Urinary bladder appears normal. No significant adenopathy is noted.  IMPRESSION: Findings consistent with acute appendicitis without abscess formation.   Electronically Signed   By: JaMarijo ConceptionM.D.   On: 03/02/2015 10:20    Review of Systems  Constitutional: Negative for fever and chills.  HENT: Negative for sore throat.   Eyes: Negative for blurred vision.  Respiratory: Negative for cough.   Cardiovascular: Negative for chest pain.  Gastrointestinal: Positive for nausea, vomiting and abdominal pain. Negative for diarrhea, constipation and blood in stool.  Genitourinary: Negative for dysuria, urgency and frequency.  Musculoskeletal: Negative for myalgias.  Skin: Negative for rash.  Neurological: Negative for dizziness and headaches.    Blood pressure 114/77, pulse 65, temperature 98.3 F (36.8 C), temperature source Oral, resp. rate 16, height 5' 9"  (1.753 m), weight 83.008 kg (183 lb), SpO2 100 %. Physical Exam  Constitutional: He is oriented to person, place, and time. He appears well-developed and well-nourished.  HENT:  Head: Normocephalic and atraumatic.  Eyes: Conjunctivae are normal. Pupils are equal, round, and reactive to light.  Neck: Normal range of motion.  Cardiovascular: Normal rate and regular rhythm.   Respiratory: Effort normal and breath sounds normal.  GI: Soft. He exhibits no distension. There is tenderness. There is guarding. There is no rebound.  RLQ  Musculoskeletal: Normal range of motion.  Neurological: He is alert and oriented to person, place, and time.  Skin: Skin is warm and dry.     Assessment/Plan [age].o. M with acute appendicitis by exam, CT and history.  I  believe surgical resection is the best management for this problem.  I have discussed with him the typical operative course as well as recovery time.  We discussed risks of surgery, which include but are not limited to bleeding, infection, leak of staple line and ileus.  I believe he understands this and has agreed to proceed with surgery.   Yuvraj Pfeifer C. 02/[birth year]/28631:8:17M

## 2015-03-02 NOTE — ED Notes (Signed)
Pt requesting pain medication. Phone call completed to [phone], page sent to Dr Maisie Fus

## 2015-03-02 NOTE — Anesthesia Procedure Notes (Signed)
Procedure Name: Intubation Date/Time: 03/02/2015 1:33 PM Performed by: Junious Silk Pre-anesthesia Checklist: Patient identified, Emergency Drugs available, Suction available, Patient being monitored and Timeout performed Patient Re-evaluated:Patient Re-evaluated prior to inductionOxygen Delivery Method: Circle system utilized Preoxygenation: Pre-oxygenation with 100% oxygen Intubation Type: IV induction, Rapid sequence and Cricoid Pressure applied Laryngoscope Size: Miller and 2 Grade View: Grade I Tube type: Oral Tube size: 7.5 mm Number of attempts: 1 Airway Equipment and Method: Stylet Placement Confirmation: ETT inserted through vocal cords under direct vision,  positive ETCO2,  CO2 detector and breath sounds checked- equal and bilateral Secured at: 22 cm Tube secured with: Tape Dental Injury: Teeth and Oropharynx as per pre-operative assessment

## 2015-03-02 NOTE — Anesthesia Preprocedure Evaluation (Addendum)
Anesthesia Evaluation  Patient identified by MRN, date of birth, ID band Patient awake    Reviewed: Allergy & Precautions, NPO status , Patient's Chart, lab work & pertinent test results  Airway Mallampati: II  TM Distance: >3 FB Neck ROM: Full    Dental no notable dental hx.    Pulmonary Current Smoker,  breath sounds clear to auscultation  Pulmonary exam normal       Cardiovascular negative cardio ROS Normal cardiovascular examRhythm:Regular Rate:Normal     Neuro/Psych negative neurological ROS  negative psych ROS   GI/Hepatic negative GI ROS, Neg liver ROS,   Endo/Other  negative endocrine ROS  Renal/GU negative Renal ROS  negative genitourinary   Musculoskeletal negative musculoskeletal ROS (+)   Abdominal   Peds negative pediatric ROS (+)  Hematology negative hematology ROS (+)   Anesthesia Other Findings   Reproductive/Obstetrics negative OB ROS                            Anesthesia Physical Anesthesia Plan  ASA: II and emergent  Anesthesia Plan: General   Post-op Pain Management:    Induction: Intravenous  Airway Management Planned: Oral ETT  Additional Equipment:   Intra-op Plan:   Post-operative Plan: Extubation in OR  Informed Consent: I have reviewed the patients History and Physical, chart, labs and discussed the procedure including the risks, benefits and alternatives for the proposed anesthesia with the patient or authorized representative who has indicated his/her understanding and acceptance.   Dental advisory given  Plan Discussed with: CRNA  Anesthesia Plan Comments:        Anesthesia Quick Evaluation

## 2015-03-03 MED ORDER — HYDROCODONE-ACETAMINOPHEN 5-325 MG PO TABS: 1.0000 | ORAL_TABLET | ORAL | Status: DC | PRN

## 2015-03-03 MED ORDER — SENNOSIDES-DOCUSATE SODIUM 8.6-50 MG PO TABS: 1.0000 | ORAL_TABLET | Freq: Two times a day (BID) | ORAL | Status: DC | PRN

## 2015-03-03 NOTE — Discharge Summary (Signed)
Physician Discharge Summary  Patient ID: Arthur Powell MRN: [id number] DOB/AGE: Feb 29, 1976 39 y.o.  Admit date: 03/02/2015 Discharge date: 03/03/2015  Admission Diagnoses:  Discharge Diagnoses:  Active Problems:   Acute appendicitis   Discharged Condition: good  Hospital Course: patient admitted after laparoscopic appendectomy.  He did well overnight.  Discharged in stable condition.  Consults: None  Significant Diagnostic Studies: labs: cbc, CT scan  Treatments: IV hydration, analgesia: acetaminophen w/ codeine and surgery: see above  Discharge Exam: Blood pressure 112/52, pulse 54, temperature 97.9 F (36.6 C), temperature source Oral, resp. rate 17, height  (1.753 m), weight 83.008 kg (183 lb), SpO2 98 %. General appearance: alert and cooperative GI: normal findings: soft, non-tender Incision/Wound: clean, dry, intact  Disposition: Home    Medication List    TAKE these medications        HYDROcodone-acetaminophen 5-325 MG per tablet  Commonly known as:  NORCO/VICODIN  Take 1-2 tablets by mouth every 4 (four) hours as needed for moderate pain.     ibuprofen 200 MG tablet  Commonly known as:  ADVIL,MOTRIN  Take 600-800 mg by mouth every 6 (six) hours as needed for moderate pain.     senna-docusate 8.6-50 MG per tablet  Commonly known as:  Senokot-S  Take 1 tablet by mouth 2 (two) times daily as needed for mild constipation.           Follow-up Information    Follow up with Vanita Panda., MD. Schedule an appointment as soon as possible for a visit in 2 weeks.   Specialty:  General Surgery   Contact information:   [address] [place] [address] [phone]       Signed: Vanita Panda 03/03/2015, 8:49 AM

## 2015-03-03 NOTE — Progress Notes (Signed)
Doy Mince to be D/C'd Home per MD order.  Discussed prescriptions and follow up appointments with the patient. Prescriptions given to patient, medication list explained in detail. Pt verbalized understanding.    Medication List    TAKE these medications        HYDROcodone-acetaminophen 5-325 MG per tablet  Commonly known as:  NORCO/VICODIN  Take 1-2 tablets by mouth every 4 (four) hours as needed for moderate pain.     ibuprofen 200 MG tablet  Commonly known as:  ADVIL,MOTRIN  Take 600-800 mg by mouth every 6 (six) hours as needed for moderate pain.     senna-docusate 8.6-50 MG per tablet  Commonly known as:  Senokot-S  Take 1 tablet by mouth 2 (two) times daily as needed for mild constipation.        Filed Vitals:   03/03/15 0522  BP: 112/52  Pulse: 54  Temp: 97.9 F (36.6 C)  Resp: 17    Skin clean, dry and intact without evidence of skin break down, no evidence of skin tears noted. IV catheter discontinued intact. Site without signs and symptoms of complications. Dressing and pressure applied. Pt denies pain at this time. No complaints noted.  An After Visit Summary was printed and given to the patient. Patient escorted via WC, and D/C home via private auto.  Viviana Simpler S 03/03/2015 9:12 AM

## 2015-03-03 NOTE — Discharge Instructions (Signed)
Appendicitis Appendicitis is when the appendix is swollen (inflamed). The inflammation can lead to developing a hole (perforation) and a collection of pus (abscess). CAUSES  There is not always an obvious cause of appendicitis. Sometimes it is caused by an obstruction in the appendix. The obstruction can be caused by:  A small, hard, pea-sized ball of stool (fecalith).  Enlarged lymph glands in the appendix. SYMPTOMS   Pain around your belly button (navel) that moves toward your lower right belly (abdomen). The pain can become more severe and sharp as time passes.  Tenderness in the lower right abdomen. Pain gets worse if you cough or make a sudden movement.  Feeling sick to your stomach (nauseous).  Throwing up (vomiting).  Loss of appetite.  Fever.  Constipation.  Diarrhea.  Generally not feeling well. DIAGNOSIS   Physical exam.  Blood tests.  Urine test.  X-rays or a CT scan may confirm the diagnosis. TREATMENT  Once the diagnosis of appendicitis is made, the most common treatment is to remove the appendix as soon as possible. This procedure is called appendectomy. In an open appendectomy, a cut (incision) is made in the lower right abdomen and the appendix is removed. In a laparoscopic appendectomy, usually 3 small incisions are made. Long, thin instruments and a camera tube are used to remove the appendix. Most patients go home in 24 to 48 hours after appendectomy. In some situations, the appendix may have already perforated and an abscess may have formed. The abscess may have a "wall" around it as seen on a CT scan. In this case, a drain may be placed into the abscess to remove fluid, and you may be treated with antibiotic medicines that kill germs. The medicine is given through a tube in your vein (IV). Once the abscess has resolved, it may or may not be necessary to have an appendectomy. You may need to stay in the hospital longer than 48 hours. Document Released:  07/06/2005 Document Revised: 01/05/2012 Document Reviewed: 10/01/2009 [facility] Patient Information 2015 [place], [place]. This information is not intended to replace advice given to you by your health care provider. Make sure you discuss any questions you have with your health care provider.   LAPAROSCOPIC SURGERY: POST OP INSTRUCTIONS  1. DIET: Follow a light bland diet the first 24 hours after arrival home, such as soup, liquids, crackers, etc.  Be sure to include lots of fluids daily.  Avoid fast food or heavy meals as your are more likely to get nauseated.  Eat a low fat the next few days after surgery.   2. Take your usually prescribed home medications unless otherwise directed. 3. PAIN CONTROL: a. Pain is best controlled by a usual combination of three different methods TOGETHER: i. Ice/Heat ii. Over the counter pain medication iii. Prescription pain medication b. Most patients will experience some swelling and bruising around the incisions.  Ice packs or heating pads (30-60 minutes up to 6 times a day) will help. Use ice for the first few days to help decrease swelling and bruising, then switch to heat to help relax tight/sore spots and speed recovery.  Some people prefer to use ice alone, heat alone, alternating between ice & heat.  Experiment to what works for you.  Swelling and bruising can take several weeks to resolve.   c. It is helpful to take an over-the-counter pain medication regularly for the first few weeks.  Choose one of the following that works best for you: i. Naproxen (Aleve, etc)  Two  tabs twice a day ii. Ibuprofen (Advil, etc) Three  tabs four times a day (every meal & bedtime) d. A  prescription for pain medication (such as percocet, vicodin, oxycodone, hydrocodone, etc) should be given to you upon discharge.  Take your pain medication as prescribed.  i. If you are having problems/concerns with the prescription medicine (does not control pain, nausea, vomiting,  rash, itching, etc), please call us [phone] to see if we need to switch you to a different pain medicine that will work better for you and/or control your side effect better. ii. If you need a refill on your pain medication, please contact your pharmacy.  They will contact our office to request authorization. Prescriptions will not be filled after 5 pm or on week-ends.   4. Avoid getting constipated.  Between the surgery and the pain medications, it is common to experience some constipation.  Increasing fluid intake and taking a fiber supplement (such as Metamucil, Citrucel, FiberCon, MiraLax, etc) 1-2 times a day regularly will usually help prevent this problem from occurring.  A mild laxative (prune juice, Milk of Magnesia, MiraLax, etc) should be taken according to package directions if there are no bowel movements after 48 hours.   5. Watch out for diarrhea.  If you have many loose bowel movements, simplify your diet to bland foods & liquids for a few days.  Stop any stool softeners and decrease your fiber supplement.  Switching to mild anti-diarrheal medications (Kayopectate, Pepto Bismol) can help.  If this worsens or does not improve, please call us. 6. Wash / shower every day.  You may shower over the dressings as they are waterproof.  Continue to shower over incision(s) after the dressing is off. 7. Remove your waterproof bandages 5 days after surgery.  You may leave the incision open to air.  You may replace a dressing/Band-Aid to cover the incision for comfort if you wish.  8. ACTIVITIES as tolerated:   a. You may resume regular (light) daily activities beginning the next day--such as daily self-care, walking, climbing stairs--gradually increasing activities as tolerated.  If you can walk 30 minutes without difficulty, it is safe to try more intense activity such as jogging, treadmill, bicycling, low-impact aerobics, swimming, etc. b. Save the most intensive and strenuous activity for  last such as sit-ups, heavy lifting, contact sports, etc  Refrain from any heavy lifting or straining until you are off narcotics for pain control.   c. DO NOT PUSH THROUGH PAIN.  Let pain be your guide: If it hurts to do something, don't do it.  Pain is your body warning you to avoid that activity for another week until the pain goes down. d. You may drive when you are no longer taking prescription pain medication, you can comfortably wear a seatbelt, and you can safely maneuver your car and apply brakes. e. Bonita Quin may have sexual intercourse when it is comfortable.  9. FOLLOW UP in our office a. Please call [facility] at [phone] to set up an appointment to see your surgeon in the office for a follow-up appointment approximately 2-3 weeks after your surgery. b. Make sure that you call for this appointment the day you arrive home to insure a convenient appointment time. 10. IF YOU HAVE DISABILITY OR FAMILY LEAVE FORMS, BRING THEM TO THE OFFICE FOR PROCESSING.  DO NOT GIVE THEM TO YOUR DOCTOR.   WHEN TO CALL us [phone]: 1. Poor pain control 2. Reactions / problems with new  medications (rash/itching, nausea, etc)  3. Fever over 101.5 F (38.5 C) 4. Inability to urinate 5. Nausea and/or vomiting 6. Worsening swelling or bruising 7. Continued bleeding from incision. 8. Increased pain, redness, or drainage from the incision   The clinic staff is available to answer your questions during regular business hours (8:30am-5pm).  Please dont hesitate to call and ask to speak to one of our nurses for clinical concerns.   If you have a medical emergency, go to the nearest emergency room or call 911.  A surgeon from [facility] is always on call at the [facility], [place] [address], [address], [place], [place]  [address] ? MAIN: [phone] ? TOLL FREE: [phone] ?  FAX [phone] [email or web address]

## 2015-03-04 ENCOUNTER — Encounter (HOSPITAL_COMMUNITY): Payer: Self-pay | Admitting: General Surgery

## 2015-03-04 LAB — URINE CULTURE: Culture: NO GROWTH

## 2016-11-13 ENCOUNTER — Emergency Department (HOSPITAL_BASED_OUTPATIENT_CLINIC_OR_DEPARTMENT_OTHER): Payer: BLUE CROSS/BLUE SHIELD

## 2016-11-13 ENCOUNTER — Encounter (HOSPITAL_BASED_OUTPATIENT_CLINIC_OR_DEPARTMENT_OTHER): Payer: Self-pay | Admitting: Emergency Medicine

## 2016-11-13 DIAGNOSIS — M67813 Other specified disorders of tendon, right shoulder: Secondary | ICD-10-CM | POA: Diagnosis not present

## 2016-11-13 DIAGNOSIS — F17200 Nicotine dependence, unspecified, uncomplicated: Secondary | ICD-10-CM | POA: Diagnosis not present

## 2016-11-13 DIAGNOSIS — M25511 Pain in right shoulder: Secondary | ICD-10-CM | POA: Diagnosis present

## 2016-11-13 NOTE — ED Triage Notes (Signed)
PT presents to ED with complaints of right shoulder that he has had intermittent for a years but today is much worse and states he has weakness and trouble moving arm.

## 2016-11-14 ENCOUNTER — Emergency Department (HOSPITAL_BASED_OUTPATIENT_CLINIC_OR_DEPARTMENT_OTHER)
Admission: EM | Admit: 2016-11-14 | Discharge: 2016-11-14 | Disposition: A | Discharge status: Home / Self Care | Payer: BLUE CROSS/BLUE SHIELD | Attending: Emergency Medicine | Admitting: Emergency Medicine

## 2016-11-14 DIAGNOSIS — M67911 Unspecified disorder of synovium and tendon, right shoulder: Secondary | ICD-10-CM

## 2016-11-14 MED ORDER — IBUPROFEN 600 MG PO TABS: 600.0000 mg | ORAL_TABLET | Freq: Three times a day (TID) | ORAL | 0 refills | Status: AC | PRN

## 2016-11-14 MED ORDER — LIDOCAINE HCL 1 % IJ SOLN
INTRAMUSCULAR | Status: AC
  Filled 2016-11-14: qty 20

## 2016-11-14 MED ORDER — HYDROCODONE-ACETAMINOPHEN 5-325 MG PO TABS: 1.0000 | ORAL_TABLET | ORAL | 0 refills | Status: DC | PRN

## 2016-11-14 NOTE — ED Notes (Signed)
Pt discharged to home NAD.  

## 2016-11-14 NOTE — ED Provider Notes (Signed)
[facility] Provider Note   CSN: [id number] Arrival date & time: 11/13/16  2319     History   Chief Complaint Chief Complaint  Patient presents with  . Shoulder Pain    HPI Vale Mousseau is a 41 y.o. male.  The history is provided by the patient.  Shoulder Pain   This is a chronic problem. The current episode started more than 1 week ago. The problem occurs daily. The problem has been rapidly worsening. The pain is present in the right shoulder. The quality of the pain is described as sharp. The pain is severe. Associated symptoms include limited range of motion and stiffness. Pertinent negatives include no numbness and no tingling. The symptoms are aggravated by contact. He has tried OTC pain medications for the symptoms. The treatment provided no relief. There has been no history of extremity trauma.  patient presents with right shoulder pain He reports he has had pain in right shoulder on/off for a year (denies trauma) He went golfing on 4/27, and soon after developed severe pain in right shoulder.  No trauma He reports limited ROM due to pain No neck/back pain No cp   PMH - none Patient Active Problem List   Diagnosis Date Noted  . Acute appendicitis 03/02/2015    Past Surgical History:  Procedure Laterality Date  . LAPAROSCOPIC APPENDECTOMY N/A 03/02/2015   Procedure: APPENDECTOMY LAPAROSCOPIC;  Surgeon: Romie Levee, MD;  Location: [facility];  Service: General;  Laterality: N/A;       Home Medications    Prior to Admission medications   Medication Sig Start Date End Date Taking? Authorizing Provider  HYDROcodone-acetaminophen (NORCO/VICODIN) 5-325 MG tablet Take 1 tablet by mouth every 4 (four) hours as needed. 11/14/16   Zadie Rhine, MD  ibuprofen (ADVIL,MOTRIN) 600 MG tablet Take 1 tablet (600 mg total) by mouth every 8 (eight) hours as needed. 11/14/16   Zadie Rhine, MD    Family History No family history on file.  Social  History Social History  Substance Use Topics  . Smoking status: Current Every Day Smoker  . Smokeless tobacco: Not on file  . Alcohol use Yes     Allergies   Morphine and related   Review of Systems Review of Systems  Constitutional: Negative for fever.  Cardiovascular: Negative for chest pain.  Musculoskeletal: Positive for arthralgias and stiffness. Negative for back pain and neck pain.  Skin: Negative for color change.  Neurological: Negative for tingling and numbness.  All other systems reviewed and are negative.    Physical Exam Updated Vital Signs BP 131/78 (BP Location: Left Arm)   Pulse 85   Temp 98.4 F (36.9 C) (Oral)   Resp 20   Ht  (1.727 m)   Wt 86.2 kg   SpO2 100%   BMI 28.89 kg/m   Physical Exam CONSTITUTIONAL: Well developed/well nourished, anxious HEAD: Normocephalic/atraumatic ENMT: Mucous membranes moist NECK: supple no meningeal signs SPINE/BACK:entire spine nontender CV: S1/S2 noted, no murmurs/rubs/gallops noted LUNGS: Lungs are clear to auscultation bilaterally, no apparent distress ABDOMEN: soft, nontender NEURO: Pt is awake/alert/appropriate, moves all extremitiesx4.  No facial droop.  Equal power (5/5) with hand grip, wrist flex/extension, elbow flex/extension, No focal sensory deficit to light touch is noted in either UE.   Equal (2+) biceps/brachioradialis/tricep reflex in bilateral UE ROM of right shoulder limited due to pain EXTREMITIES: pulses normal/equal, full ROM, tenderness to palpation of right shoulder.  No deformity/warmth noted.  He can abduct right shoulder but  limited due to pain.   SKIN: warm, color normal PSYCH: anxious  ED Treatments / Results  Labs (all labs ordered are listed, but only abnormal results are displayed) Labs Reviewed - No data to display  EKG  EKG Interpretation None       Radiology Dg Shoulder Right  Result Date: 11/14/2016 CLINICAL DATA:  Severe RIGHT shoulder pain after playing golf  today. EXAM: RIGHT SHOULDER - 2+ VIEW COMPARISON:  None. FINDINGS: The humeral head is well-formed and located. Fluffy calcifications projecting superior to the humeral head. The subacromial, glenohumeral and acromioclavicular joint spaces are intact. No destructive bony lesions. Soft tissue planes are non-suspicious. IMPRESSION: Supraspinatus calcific tendinopathy.  No acute osseous process. Electronically Signed   By: Awilda Metro M.D.   On: 11/14/2016 00:03    Procedures Injection of joint Date/Time: 11/14/2016 3:10 AM Performed by: Zadie Rhine Authorized by: Zadie Rhine  Consent: Verbal consent obtained. Risks and benefits: risks, benefits and alternatives were discussed Consent given by: patient Patient identity confirmed: verbally with patient and provided demographic data Time out: Immediately prior to procedure a "time out" was called to verify the correct patient, procedure, equipment, support staff and site/side marked as required. Preparation: Patient was prepped and draped in the usual sterile fashion. Local anesthesia used: yes Anesthesia: local infiltration  Anesthesia: Local anesthesia used: yes Local Anesthetic: lidocaine 1% without epinephrine Anesthetic total: 11 mL  Sedation: Patient sedated: no Patient tolerance: Patient tolerated the procedure well with no immediate complications Comments: Lidocaine injection of right shoulder (subacromial space) Pt tolerated well    SPLINT APPLICATION Date/Time: 0300 Authorized by: Joya Gaskins Consent: Verbal consent obtained. Risks and benefits: risks, benefits and alternatives were discussed Consent given by: patient Splint applied by: nurse Location details: right upper extremity Splint type: sling Supplies used: sling Post-procedure: The splinted body part was neurovascularly unchanged following the procedure. Patient tolerance: Patient tolerated the procedure well with no immediate  complications.     Medications Ordered in ED Medications  lidocaine (XYLOCAINE) 1 % (with pres) injection (not administered)     Initial Impression / Assessment and Plan / ED Course  I have reviewed the triage vital signs and the nursing notes.  Pertinent  imaging results that were available during my care of the patient were reviewed by me and considered in my medical decision making (see chart for details).     Pt drove himself which limits medications in the ED He was very uncomfortable, pacing around room  He reports pain on/off for a year, but worse since golfing 18 holes on 4/27 He requested "a joint injection" We discussed risks of injection (infection) but also that it would be limited for pain relief After discussion, he still requested lidocaine injection He tolerated procedure well and felt improved Given vicodin/ibuprofen and close f/u with ortho/sports med Narcotic database reviewed and considered in decision making   Final Clinical Impressions(s) / ED Diagnoses   Final diagnoses:  Tendinopathy of right shoulder    New Prescriptions Discharge Medication List as of 11/14/2016  3:02 AM       Zadie Rhine, MD 11/14/16 [phone]

## 2019-04-06 ENCOUNTER — Ambulatory Visit
Admission: EM | Admit: 2019-04-06 | Discharge: 2019-04-06 | Disposition: A | Discharge status: Home / Self Care | Payer: BC Managed Care – PPO | Attending: Emergency Medicine | Admitting: Emergency Medicine

## 2019-04-06 ENCOUNTER — Other Ambulatory Visit: Payer: Self-pay

## 2019-04-06 DIAGNOSIS — L723 Sebaceous cyst: Secondary | ICD-10-CM | POA: Diagnosis not present

## 2019-04-06 DIAGNOSIS — L089 Local infection of the skin and subcutaneous tissue, unspecified: Secondary | ICD-10-CM | POA: Diagnosis not present

## 2019-04-06 MED ORDER — DOXYCYCLINE HYCLATE 100 MG PO CAPS: 100.0000 mg | ORAL_CAPSULE | Freq: Two times a day (BID) | ORAL | 0 refills | Status: AC

## 2019-04-06 NOTE — Discharge Instructions (Addendum)
Keep the area clean and dry. Use hot compresses for 5 minutes 3-4 times a day. Take antibiotic as prescribed with food.  Return for worsening pain, decreased neck range of motion, fever.

## 2019-04-06 NOTE — ED Triage Notes (Signed)
Pt presents to UC w/ c/o boil/abscess on left shoulder neck area. Pt reports it started as small bump 3 weeks ago and it popped on its own. Pt reports he did not press to pop it. Pt reports bump has grown back larger and more painful and has been covering it up.

## 2019-04-06 NOTE — ED Provider Notes (Signed)
[facility]    CSN: [id number] Arrival date & time: 04/06/19  1244      History   Chief Complaint Chief Complaint  Patient presents with  . mass/abscess    HPI Arthur Powell is a 43 y.o. male presenting for right-sided neck mass/pain/redness.  States that started out about 3 weeks ago as a small bump, was able to pop on its own without him pushing on it.  Has not been take anything for this.  Patient does endorse history of sebaceous cyst on his lower back.  Denies neck stiffness, fever, change in vision, upper extremity weakness/paresthesia.  Denies history of MRSA infection or immunocompromise state.  History reviewed. No pertinent past medical history.  Patient Active Problem List   Diagnosis Date Noted  . Acute appendicitis 03/02/2015    Past Surgical History:  Procedure Laterality Date  . LAPAROSCOPIC APPENDECTOMY N/A 03/02/2015   Procedure: APPENDECTOMY LAPAROSCOPIC;  Surgeon: Romie Levee, MD;  Location: [facility];  Service: General;  Laterality: N/A;  . VASECTOMY         Home Medications    Prior to Admission medications   Medication Sig Start Date End Date Taking? Authorizing Provider  doxycycline (VIBRAMYCIN) 100 MG capsule Take 1 capsule (100 mg total) by mouth 2 (two) times daily for 7 days. 04/06/19 04/13/19  Hall-Potvin, [place], PA-C  ibuprofen (ADVIL,MOTRIN) 600 MG tablet Take 1 tablet (600 mg total) by mouth every 8 (eight) hours as needed. 11/14/16   Zadie Rhine, MD    Family History Family History  Problem Relation Age of Onset  . Diverticulitis Mother   . Diabetes Father     Social History Social History   Tobacco Use  . Smoking status: Current Every Day Smoker  . Smokeless tobacco: Never Used  . Tobacco comment: 1.5 packs a day  Substance Use Topics  . Alcohol use: Yes    Comment: 3-6 beers a day  . Drug use: Yes    Types: Marijuana    Comment: 3-4 times a week     Allergies   Codeine and Morphine and related    Review of Systems Review of Systems  Constitutional: Negative for fatigue and fever.  Respiratory: Negative for cough and shortness of breath.   Cardiovascular: Negative for chest pain and palpitations.  Gastrointestinal: Negative for abdominal pain, diarrhea and vomiting.  Musculoskeletal: Negative for arthralgias and myalgias.  Skin: Positive for wound. Negative for rash.  Neurological: Negative for speech difficulty and headaches.  All other systems reviewed and are negative.    Physical Exam Triage Vital Signs ED Triage Vitals  Enc Vitals Group     BP 04/06/19 1251 138/86     Pulse Rate 04/06/19 1251 98     Resp 04/06/19 1251 18     Temp 04/06/19 1251 98.1 F (36.7 C)     Temp Source 04/06/19 1251 Oral     SpO2 04/06/19 1251 98 %     Weight --      Height --      Head Circumference --      Peak Flow --      Pain Score 04/06/19 1256 3     Pain Loc --      Pain Edu? --      Excl. in GC? --    No data found.  Updated Vital Signs BP 138/86 (BP Location: Left Arm)   Pulse 98   Temp 98.1 F (36.7 C) (Oral)   Resp 18  SpO2 98%    Physical Exam Constitutional:      General: He is not in acute distress. HENT:     Head: Normocephalic and atraumatic.  Eyes:     General: No scleral icterus.    Pupils: Pupils are equal, round, and reactive to light.  Neck:     Musculoskeletal: Normal range of motion and neck supple.     Comments: 1.5 cm area of erythema, fluctuance, with point 5 cm surrounding induration.  Warm and exquisitely tender to palpation.  No active discharge or open wound streaking. Cardiovascular:     Rate and Rhythm: Normal rate.  Pulmonary:     Effort: Pulmonary effort is normal.  Lymphadenopathy:     Cervical: No cervical adenopathy.  Skin:    Coloration: Skin is not jaundiced or pale.  Neurological:     Mental Status: He is alert and oriented to person, place, and time.      UC Treatments / Results  Labs (all labs ordered are listed,  but only abnormal results are displayed) Labs Reviewed - No data to display  EKG   Radiology No results found.  Procedures Incision and Drainage  Date/Time: 04/06/2019 1:40 PM Performed by: Quincy Sheehan, PA-C Authorized by: Quincy Sheehan, PA-C   Consent:    Consent obtained:  Verbal   Consent given by:  Patient   Risks discussed:  Bleeding, incomplete drainage, pain, damage to other organs and infection   Alternatives discussed:  No treatment Universal protocol:    Patient identity confirmed:  Verbally with patient Location:    Type:  Abscess   Size:  1.5cm   Location:  Neck   Neck location:  R posterior Pre-procedure details:    Skin preparation:  Chloraprep Anesthesia (see MAR for exact dosages):    Anesthesia method:  Local infiltration   Local anesthetic:  Lidocaine 2% w/o epi Procedure type:    Complexity:  Simple Procedure details:    Needle aspiration: no     Incision types:  Single straight   Incision depth:  Dermal   Scalpel blade:  11   Wound management:  Probed and deloculated, irrigated with saline and extensive cleaning   Drainage:  Purulent and bloody   Drainage amount:  Copious   Wound treatment:  Wound left open   Packing materials:  None Post-procedure details:    Patient tolerance of procedure:  Tolerated well, no immediate complications   (including critical care time)  Medications Ordered in UC Medications - No data to display  Initial Impression / Assessment and Plan / UC Course  I have reviewed the triage vital signs and the nursing notes.  Pertinent labs & imaging results that were available during my care of the patient were reviewed by me and considered in my medical decision making (see chart for details).     1.  Infected sebaceous cyst I&D done in office, which patient tolerated well.  Will initiate doxycycline today, have patient follow-up in urgent care on Monday for repeat evaluation by this provider.   Surgical center information provided should this issue become recurrent.  Return precautions discussed, patient verbalized understanding and is agreeable to plan. Final Clinical Impressions(s) / UC Diagnoses   Final diagnoses:  Infected sebaceous cyst     Discharge Instructions     Keep the area clean and dry. Use hot compresses for 5 minutes 3-4 times a day. Take antibiotic as prescribed with food.  Return for worsening pain, decreased neck range of motion,  fever.    ED Prescriptions    Medication Sig Dispense Auth. Provider   doxycycline (VIBRAMYCIN) 100 MG capsule Take 1 capsule (100 mg total) by mouth 2 (two) times daily for 7 days. 14 capsule Hall-Potvin, [place], PA-C     PDMP not reviewed this encounter.   Odette FractionHall-Potvin, [place]itt[place], [place]-C 04/06/19 1341

## 2019-11-12 ENCOUNTER — Ambulatory Visit
Admission: EM | Admit: 2019-11-12 | Discharge: 2019-11-12 | Disposition: A | Discharge status: Home / Self Care | Payer: BC Managed Care – PPO | Attending: Emergency Medicine | Admitting: Emergency Medicine

## 2019-11-12 ENCOUNTER — Other Ambulatory Visit: Payer: Self-pay

## 2019-11-12 ENCOUNTER — Encounter: Payer: Self-pay | Admitting: Emergency Medicine

## 2019-11-12 DIAGNOSIS — L089 Local infection of the skin and subcutaneous tissue, unspecified: Secondary | ICD-10-CM | POA: Diagnosis not present

## 2019-11-12 DIAGNOSIS — L723 Sebaceous cyst: Secondary | ICD-10-CM

## 2019-11-12 MED ORDER — DOXYCYCLINE HYCLATE 100 MG PO CAPS
100.0000 mg | ORAL_CAPSULE | Freq: Two times a day (BID) | ORAL | 0 refills | Status: AC
Start: 2019-11-12 — End: 2019-11-19

## 2019-11-12 NOTE — ED Triage Notes (Signed)
Provider has seen patient prior to this nurse

## 2019-11-12 NOTE — Discharge Instructions (Addendum)
Keep area(s) clean and dry. Apply hot compress / towel for 5-10 minutes 3-5 times daily. Take antibiotic as prescribed with food - important to complete course. Return for worsening pain, redness, swelling, discharge, fever.  Very important to schedule follow-up with surgeon for further evaluation/management as this can help prevent recurrence.

## 2019-11-12 NOTE — ED Provider Notes (Signed)
[facility]    CSN: [id number] Arrival date & time: 11/12/19  1121      History   Chief Complaint Chief Complaint  Patient presents with  . Cyst    HPI Arthur Powell is a 44 y.o. male presenting for recurrent sebaceous cyst infection.  States it happened over the last 2 days.  Patient was previously evaluated for this by me in September 2020: Underwent successful I&D followed by doxycycline.  Patient states he never called surgeon for follow-up as he "does not like doctors ".  Patient denies difficulty breathing, swelling, fever.   History reviewed. No pertinent past medical history.  Patient Active Problem List   Diagnosis Date Noted  . Acute appendicitis 03/02/2015    Past Surgical History:  Procedure Laterality Date  . LAPAROSCOPIC APPENDECTOMY N/A 03/02/2015   Procedure: APPENDECTOMY LAPAROSCOPIC;  Surgeon: Romie Levee, MD;  Location: [facility];  Service: General;  Laterality: N/A;  . VASECTOMY         Home Medications    Prior to Admission medications   Medication Sig Start Date End Date Taking? Authorizing Provider  doxycycline (VIBRAMYCIN) 100 MG capsule Take 1 capsule (100 mg total) by mouth 2 (two) times daily for 7 days. 11/12/19 11/19/19  Hall-Potvin, [place], PA-C  ibuprofen (ADVIL,MOTRIN) 600 MG tablet Take 1 tablet (600 mg total) by mouth every 8 (eight) hours as needed. 11/14/16   Zadie Rhine, MD    Family History Family History  Problem Relation Age of Onset  . Diverticulitis Mother   . Diabetes Father     Social History Social History   Tobacco Use  . Smoking status: Current Every Day Smoker  . Smokeless tobacco: Never Used  . Tobacco comment: 1.5 packs a day  Substance Use Topics  . Alcohol use: Yes    Comment: 3-6 beers a day  . Drug use: Yes    Types: Marijuana    Comment: 3-4 times a week     Allergies   Codeine and Morphine and related   Review of Systems As per HPI   Physical Exam Triage Vital  Signs ED Triage Vitals  Enc Vitals Group     BP      Pulse      Resp      Temp      Temp src      SpO2      Weight      Height      Head Circumference      Peak Flow      Pain Score      Pain Loc      Pain Edu?      Excl. in GC?    No data found.  Updated Vital Signs BP 132/84 (BP Location: Left Arm)   Pulse 86   Temp 98.1 F (36.7 C) (Oral)   Resp 18   SpO2 96%   Visual Acuity Right Eye Distance:   Left Eye Distance:   Bilateral Distance:    Right Eye Near:   Left Eye Near:    Bilateral Near:     Physical Exam Constitutional:      General: He is not in acute distress. HENT:     Head: Normocephalic and atraumatic.  Eyes:     General: No scleral icterus.    Pupils: Pupils are equal, round, and reactive to light.  Neck:     Comments: Right posterolateral neck with 2 cm infected sebaceous cyst without opening or active  discharge.  TTP. Cardiovascular:     Rate and Rhythm: Normal rate.  Pulmonary:     Effort: Pulmonary effort is normal. No respiratory distress.     Breath sounds: No wheezing.  Musculoskeletal:     Cervical back: Normal range of motion. No rigidity or tenderness.  Lymphadenopathy:     Cervical: No cervical adenopathy.  Skin:    Coloration: Skin is not jaundiced or pale.  Neurological:     Mental Status: He is alert and oriented to person, place, and time.      UC Treatments / Results  Labs (all labs ordered are listed, but only abnormal results are displayed) Labs Reviewed - No data to display  EKG   Radiology No results found.  Procedures Incision and Drainage  Date/Time: 11/12/2019 11:42 AM Performed by: Quincy Sheehan, PA-C Authorized by: Quincy Sheehan, PA-C   Consent:    Consent obtained:  Verbal   Consent given by:  Patient   Risks discussed:  Bleeding, incomplete drainage, pain and damage to other organs   Alternatives discussed:  No treatment Universal protocol:    Patient identity confirmed:   Verbally with patient Location:    Type:  Abscess   Size:  2cm   Location:  Neck   Neck location:  R posterior Pre-procedure details:    Skin preparation:  Betadine Anesthesia (see MAR for exact dosages):    Anesthesia method:  Local infiltration   Local anesthetic:  Lidocaine 2% w/o epi Procedure type:    Complexity:  Simple Procedure details:    Incision types:  Single straight   Incision depth:  Subcutaneous   Scalpel blade:  11   Wound management:  Probed and deloculated, irrigated with saline and extensive cleaning   Drainage:  Purulent and bloody   Drainage amount:  Copious   Wound treatment:  Wound left open   Packing materials:  1/4 in gauze Post-procedure details:    Patient tolerance of procedure:  Tolerated well, no immediate complications   (including critical care time)  Medications Ordered in UC Medications - No data to display  Initial Impression / Assessment and Plan / UC Course  I have reviewed the triage vital signs and the nursing notes.  Pertinent labs & imaging results that were available during my care of the patient were reviewed by me and considered in my medical decision making (see chart for details).     Patient afebrile, nontoxic in office today.  Patient with recurrent infected sebaceous cyst.  Underwent I&D which he tolerated well.  Will follow with doxycycline and have patient follow-up with surgery.  Patient states he will follow up within this time.  Return precautions discussed, patient verbalized understanding and is agreeable to plan. Final Clinical Impressions(s) / UC Diagnoses   Final diagnoses:  Infected sebaceous cyst of skin     Discharge Instructions     Keep area(s) clean and dry. Apply hot compress / towel for 5-10 minutes 3-5 times daily. Take antibiotic as prescribed with food - important to complete course. Return for worsening pain, redness, swelling, discharge, fever.  Very important to schedule follow-up with  surgeon for further evaluation/management as this can help prevent recurrence.    ED Prescriptions    Medication Sig Dispense Auth. Provider   doxycycline (VIBRAMYCIN) 100 MG capsule Take 1 capsule (100 mg total) by mouth 2 (two) times daily for 7 days. 14 capsule Hall-Potvin, [place], PA-C     PDMP not reviewed this encounter.   Hall-Potvin,  [place], PA-C 11/12/19 1150

## 2019-12-11 ENCOUNTER — Ambulatory Visit: Payer: Self-pay | Admitting: General Surgery

## 2019-12-11 NOTE — H&P (Signed)
  The patient is a 44 year old male who presents with a complaint of Mass. 4. 4. Old male who presents to the office with a previously infected right posterior neck sebaceous cyst. He was in the emergency department last month due to this infection and required I&D. This is the second time that this has been required. He reports no further symptoms currently. He has had one other sebaceous cyst excised in the past.   Past Surgical History Renee Ramus, CMA; 12/11/2019 3:43 PM) Appendectomy Oral Surgery  Diagnostic Studies History Renee Ramus, CMA; 12/11/2019 3:43 PM) Colonoscopy never  Medication History Renee Ramus, CMA; 12/11/2019 3:49 PM) Ibuprofen (600MG  Tablet, Oral) Active. Medications Reconciled  Social History , CMA; 12/11/2019 3:43 PM) Alcohol use Heavy alcohol use, Moderate alcohol use. Caffeine use Carbonated beverages, Coffee, Tea. Illicit drug use Prefer to discuss with provider, Uses daily. Tobacco use Current every day smoker.  Family History 12/13/2019, CMA; 12/11/2019 3:43 PM) Arthritis Father, Mother. Diabetes Mellitus Father. Ischemic Bowel Disease Mother.  Other Problems 12/13/2019, CMA; 12/11/2019 3:43 PM) No pertinent past medical history     Review of Systems (Armen Ferguson CMA; 12/11/2019 3:43 PM) General Not Present- Appetite Loss, Chills, Fatigue, Fever, Night Sweats, Weight Gain and Weight Loss. Skin Not Present- Change in Wart/Mole, Dryness, Hives, Jaundice, New Lesions, Non-Healing Wounds, Rash and Ulcer. HEENT Not Present- Earache, Hearing Loss, Hoarseness, Nose Bleed, Oral Ulcers, Ringing in the Ears, Seasonal Allergies, Sinus Pain, Sore Throat, Visual Disturbances, Wears glasses/contact lenses and Yellow Eyes. Respiratory Present- Snoring. Not Present- Bloody sputum, Chronic Cough, Difficulty Breathing and Wheezing. Breast Not Present- Breast Mass, Breast Pain, Nipple Discharge and Skin  Changes. Cardiovascular Not Present- Chest Pain, Difficulty Breathing Lying Down, Leg Cramps, Palpitations, Rapid Heart Rate, Shortness of Breath and Swelling of Extremities. Gastrointestinal Present- Hemorrhoids. Not Present- Abdominal Pain, Bloating, Bloody Stool, Change in Bowel Habits, Chronic diarrhea, Constipation, Difficulty Swallowing, Excessive gas, Gets full quickly at meals, Indigestion, Nausea, Rectal Pain and Vomiting. Male Genitourinary Not Present- Blood in Urine, Change in Urinary Stream, Frequency, Impotence, Nocturia, Painful Urination, Urgency and Urine Leakage. Musculoskeletal Present- Joint Pain. Not Present- Back Pain, Joint Stiffness, Muscle Pain, Muscle Weakness and Swelling of Extremities. Neurological Not Present- Decreased Memory, Fainting, Headaches, Numbness, Seizures, Tingling, Tremor, Trouble walking and Weakness. Psychiatric Not Present- Anxiety, Bipolar, Change in Sleep Pattern, Depression, Fearful and Frequent crying. Endocrine Not Present- Cold Intolerance, Excessive Hunger, Hair Changes, Heat Intolerance, Hot flashes and New Diabetes. Hematology Not Present- Blood Thinners, Easy Bruising, Excessive bleeding, Gland problems, HIV and Persistent Infections.  Vitals (Armen Ferguson CMA; 12/11/2019 3:49 PM) 12/11/2019 3:48 PM Weight: 194.13 lb Height: 68in Body Surface Area: 2.02 m Body Mass Index: 29.52 kg/m  Temp.: 98.69F  Pulse: 98 (Regular)  P.OX: 97% (Room air) BP: 122/76(Sitting, Left Arm, Standard)        Physical Exam 12/13/2019 MD; 12/11/2019 4:02 PM)  General Mental Status-Alert. General Appearance-Cooperative.  Integumentary Note: 1 cm palpable mass in the right posterior neck without infection or fluctuance    Assessment & Plan 12/13/2019 MD; 12/11/2019 4:01 PM)  SEBACEOUS CYST (L72.3) Impression: Patient with a recurrent right posterior neck mass. This becomes inflamed and infected occasionally requires incision  and drainage in the emergency department. We discussed that excising the entire cyst can help prevent these from recurring. We discussed the typical procedure time and postoperative recovery. He would like to proceed with surgical excision.

## 2020-12-02 ENCOUNTER — Ambulatory Visit
Admission: EM | Admit: 2020-12-02 | Discharge: 2020-12-02 | Disposition: A | Discharge status: Home / Self Care | Payer: BC Managed Care – PPO | Attending: Internal Medicine | Admitting: Internal Medicine

## 2020-12-02 ENCOUNTER — Other Ambulatory Visit: Payer: Self-pay

## 2020-12-02 ENCOUNTER — Encounter: Payer: Self-pay | Admitting: Emergency Medicine

## 2020-12-02 DIAGNOSIS — A630 Anogenital (venereal) warts: Secondary | ICD-10-CM

## 2020-12-02 MED ORDER — PODOFILOX 0.5 % EX SOLN: Freq: Two times a day (BID) | CUTANEOUS | 0 refills | Status: DC

## 2020-12-02 NOTE — ED Triage Notes (Signed)
Patient reports genital warts.

## 2020-12-02 NOTE — ED Provider Notes (Signed)
[facility]    CSN: [id number] Arrival date & time: 12/02/20  1303      History   Chief Complaint Chief Complaint  Patient presents with  . Genital Warts    HPI Arthur Powell is a 45 y.o. male with a history of genital warts comes to the urgent care requesting treatment for genital warts.  Patient has been using cryotherapy with no improvement in his symptoms.  He has not tried podophyllin in the past.  No pain or discharge.  Patient is in a monogamous relationship.   HPI  History reviewed. No pertinent past medical history.  Patient Active Problem List   Diagnosis Date Noted  . Acute appendicitis 03/02/2015    Past Surgical History:  Procedure Laterality Date  . APPENDECTOMY    . LAPAROSCOPIC APPENDECTOMY N/A 03/02/2015   Procedure: APPENDECTOMY LAPAROSCOPIC;  Surgeon: Romie Levee, MD;  Location: [facility];  Service: General;  Laterality: N/A;  . VASECTOMY         Home Medications    Prior to Admission medications   Medication Sig Start Date End Date Taking? Authorizing Provider  podofilox (CONDYLOX) 0.5 % external solution Apply topically 2 (two) times daily. 12/02/20  Yes Gola Bribiesca, Britta Mccreedy, MD  ibuprofen (ADVIL,MOTRIN) 600 MG tablet Take 1 tablet (600 mg total) by mouth every 8 (eight) hours as needed. 11/14/16   Zadie Rhine, MD    Family History Family History  Problem Relation Age of Onset  . Diverticulitis Mother   . Diabetes Father     Social History Social History   Tobacco Use  . Smoking status: Current Every Day Smoker  . Smokeless tobacco: Never Used  . Tobacco comment: 1.5 packs a day  Vaping Use  . Vaping Use: Never used  Substance Use Topics  . Alcohol use: Yes    Comment: 3-6 beers a day  . Drug use: Yes    Types: Marijuana    Comment: 3-4 times a week     Allergies   Codeine and Morphine and related   Review of Systems Review of Systems  Genitourinary: Negative for penile pain, penile swelling, scrotal  swelling and testicular pain.     Physical Exam Triage Vital Signs ED Triage Vitals  Enc Vitals Group     BP 12/02/20 1445 117/79     Pulse Rate 12/02/20 1445 95     Resp 12/02/20 1445 18     Temp 12/02/20 1445 98.2 F (36.8 C)     Temp Source 12/02/20 1445 Oral     SpO2 12/02/20 1445 96 %     Weight --      Height --      Head Circumference --      Peak Flow --      Pain Score 12/02/20 1443 0     Pain Loc --      Pain Edu? --      Excl. in GC? --    No data found.  Updated Vital Signs BP 117/79 (BP Location: Left Arm)   Pulse 95   Temp 98.2 F (36.8 C) (Oral)   Resp 18   SpO2 96%   Visual Acuity Right Eye Distance:   Left Eye Distance:   Bilateral Distance:    Right Eye Near:   Left Eye Near:    Bilateral Near:     Physical Exam Vitals and nursing note reviewed.  Constitutional:      General: He is not in acute distress.  Appearance: He is not ill-appearing.  Genitourinary:    Comments: Genital Wieting 4 areas.  The largest is on the shaft of the penis and it measures about half an inch in the longest diameter.  Its circular. Neurological:     Mental Status: He is alert.      UC Treatments / Results  Labs (all labs ordered are listed, but only abnormal results are displayed) Labs Reviewed - No data to display  EKG   Radiology No results found.  Procedures Procedures (including critical care time)  Medications Ordered in UC Medications - No data to display  Initial Impression / Assessment and Plan / UC Course  I have reviewed the triage vital signs and the nursing notes.  Pertinent labs & imaging results that were available during my care of the patient were reviewed by me and considered in my medical decision making (see chart for details).    L.  Genital warts: Patient prefers to try cryotherapy for a while Podophyllin resin sent to the pharmacy to use if cryotherapy does not improve If symptoms worsen patient advised to return to  urgent care to be reevaluated. Final Clinical Impressions(s) / UC Diagnoses   Final diagnoses:  Genital warts     Discharge Instructions     Apply medication to affected areas only-twice daily for 3 days and then wait for 4 days.  You can repeat the process four more times.   ED Prescriptions    Medication Sig Dispense Auth. Provider   podofilox (CONDYLOX) 0.5 % external solution Apply topically 2 (two) times daily. 3.5 mL Felcia Huebert, Britta Mccreedy, MD     PDMP not reviewed this encounter.   Merrilee Jansky, MD 12/02/20 [phone]

## 2020-12-02 NOTE — Discharge Instructions (Signed)
Apply medication to affected areas only-twice daily for 3 days and then wait for 4 days.  You can repeat the process four more times.

## 2021-09-29 ENCOUNTER — Ambulatory Visit: Payer: BC Managed Care – PPO | Admitting: Nurse Practitioner

## 2021-09-29 ENCOUNTER — Encounter: Payer: Self-pay | Admitting: Nurse Practitioner

## 2021-09-29 ENCOUNTER — Other Ambulatory Visit: Payer: Self-pay

## 2021-09-29 VITALS — BP 136/85 | HR 85 | Temp 98.1°F | Ht 68.0 in | Wt 194.4 lb

## 2021-09-29 DIAGNOSIS — Z6829 Body mass index (BMI) 29.0-29.9, adult: Secondary | ICD-10-CM | POA: Diagnosis not present

## 2021-09-29 DIAGNOSIS — Z7689 Persons encountering health services in other specified circumstances: Secondary | ICD-10-CM | POA: Diagnosis not present

## 2021-09-29 DIAGNOSIS — R42 Dizziness and giddiness: Secondary | ICD-10-CM

## 2021-09-29 DIAGNOSIS — F17209 Nicotine dependence, unspecified, with unspecified nicotine-induced disorders: Secondary | ICD-10-CM

## 2021-09-29 NOTE — Progress Notes (Signed)
? ?New Patient Office Visit ? ?Subjective:  ?Patient ID: Arthur Powell, male    DOB: 1976-02-16  Age: 46 y.o. MRN: [id number] ? ?CC:  ?Chief Complaint  ?Patient presents with  ? New Patient (Initial Visit)  ? ? ?HPI ?Arthur Powell presents to establish new primary care provider. Was seeing a provider in [facility]. Needed to find a provider closer to home. The patient has no significant medical history.  ?-2-3 weeks ago had some dizziness. Unsure if it was related to drinking energy drinks or if he may have had ear infection. Ears were sore at the time. Were popping non-stop. Has quit drinking energy drinks. Does have to wear ear plugs all the time at work. He states that dizziness and ear pain are both resolved.  ?denies chest pain, chest pressure, or shortness of breath. He denies headaches or visual disturbances. He denies abdominal pain, nausea, vomiting, or changes in bowel or bladder habits.   ?He is due to have routine, fasting blood work.  ?He is due to have routine CPE.  ?The patient states that he is a smoker. Smokes about 1.5 packs of cigarettes per day.  ? ?History reviewed. No pertinent past medical history. ? ?Past Surgical History:  ?Procedure Laterality Date  ? APPENDECTOMY    ? LAPAROSCOPIC APPENDECTOMY N/A 03/02/2015  ? Procedure: APPENDECTOMY LAPAROSCOPIC;  Surgeon: Romie Levee, MD;  Location: [facility];  Service: General;  Laterality: N/A;  ? VASECTOMY    ? ? ?Family History  ?Problem Relation Age of Onset  ? Diverticulitis Mother   ? Diabetes Father   ? ? ?Social History  ? ?Socioeconomic History  ? Marital status: Married  ?  Spouse name: Not on file  ? Number of children: Not on file  ? Years of education: Not on file  ? Highest education level: Not on file  ?Occupational History  ? Not on file  ?Tobacco Use  ? Smoking status: Every Day  ? Smokeless tobacco: Never  ? Tobacco comments:  ?  1.5 packs a day  ?Vaping Use  ? Vaping Use: Never used  ?Substance and Sexual Activity  ?  Alcohol use: Yes  ?  Comment: 3-6 beers a day  ? Drug use: Yes  ?  Types: Marijuana  ?  Comment: 3-4 times a week  ? Sexual activity: Yes  ?Other Topics Concern  ? Not on file  ?Social History Narrative  ? Not on file  ? ?Social Determinants of Health  ? ?Financial Resource Strain: Not on file  ?Food Insecurity: Not on file  ?Transportation Needs: Not on file  ?Physical Activity: Not on file  ?Stress: Not on file  ?Social Connections: Not on file  ?Intimate Partner Violence: Not on file  ? ? ?ROS ?Review of Systems  ?Constitutional:  Negative for activity change, chills, fatigue and fever.  ?HENT:  Positive for ear pain. Negative for congestion, postnasal drip, rhinorrhea, sinus pressure, sinus pain, sneezing and sore throat.   ?     Ear pain and popping 2 to 3 weeks ago   ?Eyes: Negative.   ?Respiratory:  Negative for cough, shortness of breath and wheezing.   ?Cardiovascular:  Negative for chest pain and palpitations.  ?Gastrointestinal:  Negative for constipation, diarrhea, nausea and vomiting.  ?Endocrine: Negative for cold intolerance, heat intolerance, polydipsia and polyuria.  ?Genitourinary:  Negative for dysuria, frequency and urgency.  ?Musculoskeletal:  Negative for back pain and myalgias.  ?Skin:  Negative for rash.  ?Allergic/Immunologic: Negative for  environmental allergies.  ?Neurological:  Positive for dizziness. Negative for weakness and headaches.  ?Psychiatric/Behavioral:  The patient is not nervous/anxious.   ? ?Objective:  ? ?Today's Vitals  ? 09/29/21 0957  ?BP: 136/85  ?Pulse: 85  ?Temp: 98.1 ?F (36.7 ?C)  ?SpO2: 98%  ?Weight: 194 lb 6.4 oz (88.2 kg)  ?Height: 5\' 8"  (1.727 m)  ? ?Body mass index is 29.56 kg/m?.  ? ?Physical Exam ?Vitals and nursing note reviewed.  ?Constitutional:   ?   Appearance: Normal appearance. He is well-developed.  ?HENT:  ?   Head: Normocephalic and atraumatic.  ?   Right Ear: Hearing, ear canal and external ear normal. Tympanic membrane is erythematous.  ?   Left  Ear: Hearing, ear canal and external ear normal. Tympanic membrane is erythematous.  ?Eyes:  ?   Pupils: Pupils are equal, round, and reactive to light.  ?Cardiovascular:  ?   Rate and Rhythm: Normal rate and regular rhythm.  ?   Pulses: Normal pulses.  ?   Heart sounds: Normal heart sounds.  ?Pulmonary:  ?   Effort: Pulmonary effort is normal.  ?   Breath sounds: Normal breath sounds.  ?Abdominal:  ?   Palpations: Abdomen is soft.  ?Musculoskeletal:     ?   General: Normal range of motion.  ?   Cervical back: Normal range of motion and neck supple.  ?Lymphadenopathy:  ?   Cervical: No cervical adenopathy.  ?Skin: ?   General: Skin is warm and dry.  ?   Capillary Refill: Capillary refill takes less than 2 seconds.  ?Neurological:  ?   General: No focal deficit present.  ?   Mental Status: He is alert and oriented to person, place, and time.  ?Psychiatric:     ?   Mood and Affect: Mood normal.     ?   Behavior: Behavior normal.     ?   Thought Content: Thought content normal.     ?   Judgment: Judgment normal.  ? ? ?Assessment & Plan:  ?1. Dizziness ?This has resolved. Will check routine, fasting labs prior to next visit for further evaluation.  ? ?2. Body mass index 29.0-29.9, adult ?Encourage patient to limit calorie intake to 2000 cal/day or less.  He should consume a low cholesterol, low-fat diet.  ? ?3. Tobacco use disorder, continuous ?Patient smoking 1.5 packs of cigarettes per day. Not ready to quit at this time.  ? ?4. Encounter to establish care ?Appointment today to establish new primary care provider   ? ?  ?Problem List Items Addressed This Visit   ? ?  ? Other  ? Dizziness - Primary  ? Body mass index 29.0-29.9, adult  ? Tobacco use disorder, continuous  ? ?Other Visit Diagnoses   ? ? Encounter to establish care      ? ?  ? ? ?Outpatient Encounter Medications as of 09/29/2021  ?Medication Sig  ? ibuprofen (ADVIL,MOTRIN) 600 MG tablet Take 1 tablet (600 mg total) by mouth every 8 (eight) hours as needed.   ? [DISCONTINUED] podofilox (CONDYLOX) 0.5 % external solution Apply topically 2 (two) times daily.  ? ?No facility-administered encounter medications on file as of 09/29/2021.  ? ? ?Follow-up: Return in about 6 weeks (around 11/10/2021) for health maintenance exam, FBW a week prior to visit.  ? ?11/12/2021, NP ? ?

## 2022-11-16 ENCOUNTER — Other Ambulatory Visit: Payer: Self-pay

## 2022-11-16 ENCOUNTER — Ambulatory Visit
Admission: EM | Admit: 2022-11-16 | Discharge: 2022-11-16 | Disposition: A | Discharge status: Home / Self Care | Payer: BC Managed Care – PPO | Attending: Internal Medicine | Admitting: Internal Medicine

## 2022-11-16 DIAGNOSIS — M79671 Pain in right foot: Secondary | ICD-10-CM

## 2022-11-16 NOTE — Discharge Instructions (Signed)
I have placed a referral to podiatry which is a foot doctor.  If they do not call within 48 hours, please call them at provided contact information.

## 2022-11-16 NOTE — ED Triage Notes (Signed)
Pt c/o lesion to right foot at the base of his toes first noticed ~ 2 weeks ago

## 2022-11-16 NOTE — ED Provider Notes (Signed)
[facility]    CSN: [id number] Arrival date & time: 11/16/22  1016      History   Chief Complaint Chief Complaint  Patient presents with   lesion    HPI Arthur Powell is a 47 y.o. male.   Patient presents with lesion to the bottom of his right with associated pain.  Patient reports that he noticed the lesion about 2 weeks ago but it has seemed to worsen over the past few days.  Denies any purulent drainage.  Patient denies any injury to the area but thinks that there is something in his foot.  Although, he denies noticing that he stepped on anything.  Denies any associated fever.     History reviewed. No pertinent past medical history.  Patient Active Problem List   Diagnosis Date Noted   Dizziness 09/29/2021   Body mass index 29.0-29.9, adult 09/29/2021   Tobacco use disorder, continuous 09/29/2021   Acute appendicitis 03/02/2015    Past Surgical History:  Procedure Laterality Date   APPENDECTOMY     LAPAROSCOPIC APPENDECTOMY N/A 03/02/2015   Procedure: APPENDECTOMY LAPAROSCOPIC;  Surgeon: Romie Levee, MD;  Location: [facility];  Service: General;  Laterality: N/A;   VASECTOMY         Home Medications    Prior to Admission medications   Medication Sig Start Date End Date Taking? Authorizing Provider  ibuprofen (ADVIL,MOTRIN) 600 MG tablet Take 1 tablet (600 mg total) by mouth every 8 (eight) hours as needed. 11/14/16   Zadie Rhine, MD    Family History Family History  Problem Relation Age of Onset   Diverticulitis Mother    Diabetes Father     Social History Social History   Tobacco Use   Smoking status: Every Day   Smokeless tobacco: Never   Tobacco comments:    1.5 packs a day  Vaping Use   Vaping Use: Never used  Substance Use Topics   Alcohol use: Yes    Comment: 3-6 beers a day   Drug use: Yes    Types: Marijuana    Comment: 3-4 times a week     Allergies   Codeine and Morphine and related   Review of  Systems Review of Systems Per HPI  Physical Exam Triage Vital Signs ED Triage Vitals [11/16/22 1126]  Enc Vitals Group     BP (!) 143/81     Pulse Rate 94     Resp 16     Temp 98 F (36.7 C)     Temp Source Oral     SpO2 95 %     Weight      Height      Head Circumference      Peak Flow      Pain Score 4     Pain Loc      Pain Edu?      Excl. in GC?    No data found.  Updated Vital Signs BP (!) 143/81 (BP Location: Left Arm)   Pulse 94   Temp 98 F (36.7 C) (Oral)   Resp 16   SpO2 95%   Visual Acuity Right Eye Distance:   Left Eye Distance:   Bilateral Distance:    Right Eye Near:   Left Eye Near:    Bilateral Near:     Physical Exam Constitutional:      General: He is not in acute distress.    Appearance: Normal appearance. He is not toxic-appearing or diaphoretic.  HENT:  Head: Normocephalic and atraumatic.  Eyes:     Extraocular Movements: Extraocular movements intact.     Conjunctiva/sclera: Conjunctivae normal.  Pulmonary:     Effort: Pulmonary effort is normal.  Musculoskeletal:       Feet:  Feet:     Comments: Patient has pinpoint papular lesion to the plantar surface of the foot directly beneath the right fourth toe.  No purulent drainage noted.  There is very mild erythema and swelling surrounding this lesion.  No obvious foreign body able to be visualized or palpated.  Neurovascularly intact. Neurological:     General: No focal deficit present.     Mental Status: He is alert and oriented to person, place, and time. Mental status is at baseline.  Psychiatric:        Mood and Affect: Mood normal.        Behavior: Behavior normal.        Thought Content: Thought content normal.        Judgment: Judgment normal.      UC Treatments / Results  Labs (all labs ordered are listed, but only abnormal results are displayed) Labs Reviewed - No data to display  EKG   Radiology No results found.  Procedures Procedures (including  critical care time)  Medications Ordered in UC Medications - No data to display  Initial Impression / Assessment and Plan / UC Course  I have reviewed the triage vital signs and the nursing notes.  Pertinent labs & imaging results that were available during my care of the patient were reviewed by me and considered in my medical decision making (see chart for details).     Patient is adamant that there is foreign body in his foot causing the symptoms.  Offered patient foot x-ray to evaluate but he declined this.  Patient wishes to have foreign body removed but I am not able to visualize or palpate foreign body on exam.  I am also not convinced that this is a foreign body.  Other differentials include plantar wart.  Discussed with patient that given I am not able to palpate, visualize, confirm foreign body in the foot causing symptoms I am not able to remove anything at this time.  Advised him that he will need to follow-up with podiatry for further evaluation and management.  No obvious signs of bacterial infection on exam so antibiotics were deferred.  Ambulatory referral to podiatry was placed.  Patient advised that if they do not call him within 48 hours, he is to call them himself to schedule an appointment.  Advised strict return precautions.  Patient verbalized understanding and was agreeable with plan. Final Clinical Impressions(s) / UC Diagnoses   Final diagnoses:  Right foot pain     Discharge Instructions      I have placed a referral to podiatry which is a foot doctor.  If they do not call within 48 hours, please call them at provided contact information.    ED Prescriptions   None    PDMP not reviewed this encounter.   Gustavus Bryant, [place] 11/16/22 1207

## 2022-11-20 ENCOUNTER — Ambulatory Visit: Payer: Self-pay | Admitting: Podiatry
